# Patient Record
Sex: Male | Born: 1955 | Race: White | Hispanic: No | Marital: Married | Smoking: Never smoker
Health system: Southern US, Community
[De-identification: ages and names within clinical notes are randomized; demographics above are authoritative.]

## PROBLEM LIST (undated history)

## (undated) DIAGNOSIS — E785 Hyperlipidemia, unspecified: Secondary | ICD-10-CM

## (undated) DIAGNOSIS — I10 Essential (primary) hypertension: Secondary | ICD-10-CM

## (undated) DIAGNOSIS — I255 Ischemic cardiomyopathy: Secondary | ICD-10-CM

## (undated) DIAGNOSIS — I5023 Acute on chronic systolic (congestive) heart failure: Secondary | ICD-10-CM

## (undated) DIAGNOSIS — E119 Type 2 diabetes mellitus without complications: Secondary | ICD-10-CM

## (undated) HISTORY — PX: CARDIAC SURGERY: SHX584

## (undated) HISTORY — DX: Ischemic cardiomyopathy: I25.5

## (undated) HISTORY — PX: TONSILLECTOMY: SUR1361

## (undated) HISTORY — PX: DENTAL SURGERY: SHX609

## (undated) HISTORY — DX: Acute on chronic systolic (congestive) heart failure: I50.23

## (undated) HISTORY — DX: Hyperlipidemia, unspecified: E78.5

---

## 2012-03-13 NOTE — H&P (Signed)
Peter Griffin is an 57 y.o. male.   Chief Complaint:  Acute left quadriceps rupture On the job  03/11/12 HPI: 57yo male works for [employer] injured pulling hose to the fill tank and his foot stuck in the mud causing him to fall with left knee hyperflexion injury landing on his foot with sharp knee pain and inability to extend his knee. Seen at [facility], no xrays done. Placed in a knee immobilizer , given crutches which he has difficulty with.   No past medical history on file.    Diabetes  Type 2, oral med controlled, HTN  No past surgical history on file. neg.   No family history on file.  Stomach CA, diabetes, HTN Social History:  does not have a smoking history on file. He does not have any smokeless tobacco history on file. His alcohol and drug histories not on file.   Does not smoke, neg ETOH, neg Drug use  Allergies: Allergies not on file  NKDA  No prescriptions prior to admission  ASA 81mg  daily, Losartan/HCTZ 100/25mg  po qAM, Amlodipine CL 10 mg po q PM, Glipizide 5mg  one half tablet PO q AM, Cardura 4mg   One half tablet PO q PM,  Lutein OTC ,  Fish oil 100mg  OTC  No results found for this or any previous visit (from the past 48 hour(s)). No results found.  Review of Systems  Constitutional: Negative for fever and weight loss.  HENT: Negative for ear pain and congestion.   Eyes: Negative for blurred vision and pain.  Respiratory: Negative for cough, sputum production, shortness of breath and wheezing.   Cardiovascular: Negative for chest pain, orthopnea, claudication and leg swelling.  Gastrointestinal: Negative.   Genitourinary: Negative.   Musculoskeletal:       Above acute OTJI quad tendon rupture left  Skin: Negative for itching and rash.  Neurological: Positive for dizziness. Negative for sensory change and headaches.  Endo/Heme/Allergies:       Pos for diabetes, oral controlled  Psychiatric/Behavioral: Negative for depression, memory loss and substance  abuse. The patient is not nervous/anxious.     There were no vitals taken for this visit.   Hgt. 5'10"    Wgt 189 lbs.  Physical Exam  Constitutional: He appears well-developed and well-nourished.  HENT:  Head: Normocephalic.  Cardiovascular: Normal rate and regular rhythm.   Respiratory: Effort normal and breath sounds normal.  GI: Soft. Bowel sounds are normal.  Musculoskeletal:       2cm defect above patella with no knee extension, patella baja,   Intact pulses and sensation to foot, collateral and cruciate ligaments normal.  Left leg.             Right knee normal ROM, normal SLR, normal quad and ligament exam.  Normal right and left hip ROM  Skin: Skin is warm and dry.  Psychiatric: He has a normal mood and affect. His behavior is normal.    XRAYS LEFT KNEE--    Calcification avulsion of quad tendon retracted  2 to 3 cm with patella baja.  No degen knee changes.  Assessment/Plan Acute quad tendon rupture left knee.   He has a walker he can use over the weekend. Will proceed with acute quad tendon repair left knee.   overnite stay , possible 2nd night if not safe for ambulation POD1.  Risks of surgery discussed , infection, rerupture, stiffness, importance of post op rehab discussed. Risks of DVT, bleeding, and anesthetic problems discussed. He understands and  requests we proceed.  After 5PM, will notify W/C carrier early AM Monday.  All ?'s answered.   Antanette Richwine C 03/13/2012, 5:49 PM

## 2012-03-15 MED ORDER — DEXTROSE 5 % IV SOLN: 2.0000 g | INTRAVENOUS | Status: DC

## 2012-03-16 ENCOUNTER — Inpatient Hospital Stay (HOSPITAL_COMMUNITY): Payer: Worker's Compensation

## 2012-03-16 ENCOUNTER — Observation Stay (HOSPITAL_COMMUNITY)
Admission: RE | Admit: 2012-03-16 | Discharge: 2012-03-17 | Disposition: A | Discharge status: Home / Self Care | Payer: Worker's Compensation | Source: Ambulatory Visit | Attending: Orthopaedic Surgery | Admitting: Orthopaedic Surgery

## 2012-03-16 ENCOUNTER — Encounter (HOSPITAL_COMMUNITY): Payer: Self-pay | Admitting: *Deleted

## 2012-03-16 ENCOUNTER — Encounter (HOSPITAL_COMMUNITY)
Admission: RE | Disposition: A | Discharge status: Home / Self Care | Payer: Self-pay | Source: Ambulatory Visit | Attending: Orthopaedic Surgery

## 2012-03-16 ENCOUNTER — Encounter (HOSPITAL_COMMUNITY): Payer: Self-pay | Admitting: Anesthesiology

## 2012-03-16 ENCOUNTER — Inpatient Hospital Stay (HOSPITAL_COMMUNITY): Payer: Worker's Compensation | Admitting: Anesthesiology

## 2012-03-16 DIAGNOSIS — S838X9A Sprain of other specified parts of unspecified knee, initial encounter: Principal | ICD-10-CM | POA: Insufficient documentation

## 2012-03-16 DIAGNOSIS — Y9269 Other specified industrial and construction area as the place of occurrence of the external cause: Secondary | ICD-10-CM | POA: Insufficient documentation

## 2012-03-16 DIAGNOSIS — IMO0002 Reserved for concepts with insufficient information to code with codable children: Secondary | ICD-10-CM | POA: Insufficient documentation

## 2012-03-16 DIAGNOSIS — E119 Type 2 diabetes mellitus without complications: Secondary | ICD-10-CM | POA: Insufficient documentation

## 2012-03-16 DIAGNOSIS — S76119A Strain of unspecified quadriceps muscle, fascia and tendon, initial encounter: Secondary | ICD-10-CM

## 2012-03-16 DIAGNOSIS — I10 Essential (primary) hypertension: Secondary | ICD-10-CM | POA: Insufficient documentation

## 2012-03-16 DIAGNOSIS — Y990 Civilian activity done for income or pay: Secondary | ICD-10-CM | POA: Insufficient documentation

## 2012-03-16 DIAGNOSIS — X500XXA Overexertion from strenuous movement or load, initial encounter: Secondary | ICD-10-CM | POA: Insufficient documentation

## 2012-03-16 HISTORY — PX: QUADRICEPS TENDON REPAIR: SHX756

## 2012-03-16 HISTORY — DX: Type 2 diabetes mellitus without complications: E11.9

## 2012-03-16 HISTORY — DX: Essential (primary) hypertension: I10

## 2012-03-16 LAB — COMPREHENSIVE METABOLIC PANEL
ALT: 21 U/L (ref 0–53)
AST: 23 U/L (ref 0–37)
CO2: 26 mEq/L (ref 19–32)
Calcium: 9.2 mg/dL (ref 8.4–10.5)
Chloride: 102 mEq/L (ref 96–112)
Creatinine, Ser: 0.75 mg/dL (ref 0.50–1.35)
GFR calc Af Amer: >90 mL/min (ref >90)
GFR calc non Af Amer: >90 mL/min (ref >90)
Glucose, Bld: 176 mg/dL — ABNORMAL HIGH (ref 70–99)
Total Bilirubin: 0.5 mg/dL (ref 0.3–1.2)

## 2012-03-16 LAB — CBC
Hemoglobin: 15.4 g/dL (ref 13.0–17.0)
MCH: 29.7 pg (ref 26.0–34.0)
MCV: 84.4 fL (ref 78.0–100.0)
Platelets: 212 10*3/uL (ref 150–400)
RBC: 5.18 MIL/uL (ref 4.22–5.81)
WBC: 6.1 10*3/uL (ref 4.0–10.5)

## 2012-03-16 LAB — PROTIME-INR: INR: 1 (ref 0.00–1.49)

## 2012-03-16 LAB — GLUCOSE, CAPILLARY
Glucose-Capillary: 192 mg/dL — ABNORMAL HIGH (ref 70–99)
Glucose-Capillary: 151 mg/dL — ABNORMAL HIGH (ref 70–99)
Glucose-Capillary: 217 mg/dL — ABNORMAL HIGH (ref 70–99)

## 2012-03-16 SURGERY — REPAIR, TENDON, QUADRICEPS
Anesthesia: General | Site: Knee | Laterality: Left | Wound class: Clean

## 2012-03-16 MED ORDER — LIDOCAINE HCL (CARDIAC) 20 MG/ML IV SOLN
INTRAVENOUS | Status: DC | PRN
  Administered 2012-03-16: 80 mg via INTRAVENOUS

## 2012-03-16 MED ORDER — CEFAZOLIN SODIUM-DEXTROSE 2-3 GM-% IV SOLR
2.0000 g | Freq: Four times a day (QID) | INTRAVENOUS | Status: AC
  Administered 2012-03-16 – 2012-03-17 (×3): 2 g via INTRAVENOUS
  Filled 2012-03-16 (×5): qty 50

## 2012-03-16 MED ORDER — FENTANYL CITRATE 0.05 MG/ML IJ SOLN
INTRAMUSCULAR | Status: DC | PRN
  Administered 2012-03-16: 50 ug via INTRAVENOUS

## 2012-03-16 MED ORDER — DOCUSATE SODIUM 100 MG PO CAPS
100.0000 mg | ORAL_CAPSULE | Freq: Two times a day (BID) | ORAL | Status: DC
  Administered 2012-03-16 – 2012-03-17 (×3): 100 mg via ORAL
  Filled 2012-03-16 (×4): qty 1

## 2012-03-16 MED ORDER — KETOROLAC TROMETHAMINE 30 MG/ML IJ SOLN
30.0000 mg | Freq: Four times a day (QID) | INTRAMUSCULAR | Status: AC
  Administered 2012-03-16 – 2012-03-17 (×4): 30 mg via INTRAVENOUS
  Filled 2012-03-16 (×4): qty 1

## 2012-03-16 MED ORDER — ONDANSETRON HCL 4 MG/2ML IJ SOLN: 4.0000 mg | Freq: Once | INTRAMUSCULAR | Status: DC | PRN

## 2012-03-16 MED ORDER — GLYCOPYRROLATE 0.2 MG/ML IJ SOLN
INTRAMUSCULAR | Status: DC | PRN
  Administered 2012-03-16: 0.4 mg via INTRAVENOUS

## 2012-03-16 MED ORDER — ONDANSETRON HCL 4 MG/2ML IJ SOLN
INTRAMUSCULAR | Status: DC | PRN
  Administered 2012-03-16: 4 mg via INTRAVENOUS

## 2012-03-16 MED ORDER — EPHEDRINE SULFATE 50 MG/ML IJ SOLN
INTRAMUSCULAR | Status: DC | PRN
  Administered 2012-03-16: 10 mg via INTRAVENOUS

## 2012-03-16 MED ORDER — CEFAZOLIN SODIUM-DEXTROSE 2-3 GM-% IV SOLR
INTRAVENOUS | Status: AC
  Filled 2012-03-16: qty 50

## 2012-03-16 MED ORDER — GLIPIZIDE 2.5 MG HALF TABLET
2.5000 mg | ORAL_TABLET | Freq: Two times a day (BID) | ORAL | Status: DC
  Administered 2012-03-17 (×2): 2.5 mg via ORAL
  Filled 2012-03-16 (×3): qty 1

## 2012-03-16 MED ORDER — PROMETHAZINE HCL 25 MG/ML IJ SOLN: 6.2500 mg | INTRAMUSCULAR | Status: DC | PRN

## 2012-03-16 MED ORDER — LACTATED RINGERS IV SOLN
INTRAVENOUS | Status: DC
  Administered 2012-03-16: 11:00:00 via INTRAVENOUS

## 2012-03-16 MED ORDER — FLEET ENEMA 7-19 GM/118ML RE ENEM: 1.0000 | ENEMA | Freq: Once | RECTAL | Status: AC | PRN

## 2012-03-16 MED ORDER — MIDAZOLAM HCL 2 MG/2ML IJ SOLN
INTRAMUSCULAR | Status: AC
  Administered 2012-03-16: 2 mg via BUCCAL
  Filled 2012-03-16: qty 2

## 2012-03-16 MED ORDER — FENTANYL CITRATE 0.05 MG/ML IJ SOLN
25.0000 ug | INTRAMUSCULAR | Status: DC | PRN
  Administered 2012-03-16: 50 ug via INTRAVENOUS

## 2012-03-16 MED ORDER — ASPIRIN 325 MG PO TABS
325.0000 mg | ORAL_TABLET | Freq: Every day | ORAL | Status: DC
  Administered 2012-03-16 – 2012-03-17 (×2): 325 mg via ORAL
  Filled 2012-03-16 (×3): qty 1

## 2012-03-16 MED ORDER — 0.9 % SODIUM CHLORIDE (POUR BTL) OPTIME
TOPICAL | Status: DC | PRN
  Administered 2012-03-16: 1000 mL

## 2012-03-16 MED ORDER — ONDANSETRON HCL 4 MG PO TABS: 4.0000 mg | ORAL_TABLET | Freq: Four times a day (QID) | ORAL | Status: DC | PRN

## 2012-03-16 MED ORDER — ONDANSETRON HCL 4 MG/2ML IJ SOLN: 4.0000 mg | Freq: Four times a day (QID) | INTRAMUSCULAR | Status: DC | PRN

## 2012-03-16 MED ORDER — BISACODYL 10 MG RE SUPP: 10.0000 mg | Freq: Every day | RECTAL | Status: DC | PRN

## 2012-03-16 MED ORDER — CHLORHEXIDINE GLUCONATE 4 % EX LIQD: 60.0000 mL | Freq: Once | CUTANEOUS | Status: DC

## 2012-03-16 MED ORDER — PROPOFOL 10 MG/ML IV BOLUS
INTRAVENOUS | Status: DC | PRN
  Administered 2012-03-16: 200 mg via INTRAVENOUS

## 2012-03-16 MED ORDER — MIDAZOLAM HCL 2 MG/2ML IJ SOLN
0.5000 mg | Freq: Once | INTRAMUSCULAR | Status: AC | PRN
  Administered 2012-03-16: 1 mg via INTRAVENOUS

## 2012-03-16 MED ORDER — NEOSTIGMINE METHYLSULFATE 1 MG/ML IJ SOLN
INTRAMUSCULAR | Status: DC | PRN
  Administered 2012-03-16: 3 mg via INTRAVENOUS

## 2012-03-16 MED ORDER — AMLODIPINE BESYLATE 5 MG PO TABS
5.0000 mg | ORAL_TABLET | Freq: Every day | ORAL | Status: DC
  Administered 2012-03-16 – 2012-03-17 (×2): 5 mg via ORAL
  Filled 2012-03-16 (×3): qty 1

## 2012-03-16 MED ORDER — MUPIROCIN 2 % EX OINT
TOPICAL_OINTMENT | CUTANEOUS | Status: AC
  Filled 2012-03-16: qty 22

## 2012-03-16 MED ORDER — ZOLPIDEM TARTRATE 5 MG PO TABS: 5.0000 mg | ORAL_TABLET | Freq: Every evening | ORAL | Status: DC | PRN

## 2012-03-16 MED ORDER — INSULIN ASPART 100 UNIT/ML ~~LOC~~ SOLN
0.0000 [IU] | Freq: Three times a day (TID) | SUBCUTANEOUS | Status: DC
  Administered 2012-03-16 – 2012-03-17 (×2): 3 [IU] via SUBCUTANEOUS
  Administered 2012-03-17 (×2): 2 [IU] via SUBCUTANEOUS

## 2012-03-16 MED ORDER — SODIUM CHLORIDE 0.9 % IV SOLN: INTRAVENOUS | Status: DC

## 2012-03-16 MED ORDER — METHOCARBAMOL 500 MG PO TABS: 500.0000 mg | ORAL_TABLET | Freq: Four times a day (QID) | ORAL | Status: DC | PRN

## 2012-03-16 MED ORDER — OCUVITE-LUTEIN PO CAPS
1.0000 | ORAL_CAPSULE | Freq: Every day | ORAL | Status: DC
  Administered 2012-03-16 – 2012-03-17 (×2): 1 via ORAL
  Filled 2012-03-16 (×2): qty 1

## 2012-03-16 MED ORDER — OXYCODONE-ACETAMINOPHEN 5-325 MG PO TABS: 1.0000 | ORAL_TABLET | ORAL | Status: DC | PRN

## 2012-03-16 MED ORDER — LOSARTAN POTASSIUM-HCTZ 100-25 MG PO TABS: 1.0000 | ORAL_TABLET | Freq: Every day | ORAL | Status: DC

## 2012-03-16 MED ORDER — MEPERIDINE HCL 25 MG/ML IJ SOLN: 6.2500 mg | INTRAMUSCULAR | Status: DC | PRN

## 2012-03-16 MED ORDER — MORPHINE SULFATE 2 MG/ML IJ SOLN: 1.0000 mg | INTRAMUSCULAR | Status: DC | PRN

## 2012-03-16 MED ORDER — LOSARTAN POTASSIUM 50 MG PO TABS
100.0000 mg | ORAL_TABLET | Freq: Every day | ORAL | Status: DC
  Administered 2012-03-16 – 2012-03-17 (×2): 100 mg via ORAL
  Filled 2012-03-16 (×2): qty 2

## 2012-03-16 MED ORDER — FENTANYL CITRATE 0.05 MG/ML IJ SOLN
INTRAMUSCULAR | Status: AC
  Filled 2012-03-16: qty 2

## 2012-03-16 MED ORDER — METHOCARBAMOL 100 MG/ML IJ SOLN
500.0000 mg | Freq: Four times a day (QID) | INTRAVENOUS | Status: DC | PRN
  Filled 2012-03-16: qty 5

## 2012-03-16 MED ORDER — HYDROCODONE-ACETAMINOPHEN 5-325 MG PO TABS: 1.0000 | ORAL_TABLET | ORAL | Status: DC | PRN

## 2012-03-16 MED ORDER — METOCLOPRAMIDE HCL 5 MG/ML IJ SOLN: 5.0000 mg | Freq: Three times a day (TID) | INTRAMUSCULAR | Status: DC | PRN

## 2012-03-16 MED ORDER — SENNOSIDES-DOCUSATE SODIUM 8.6-50 MG PO TABS: 1.0000 | ORAL_TABLET | Freq: Every evening | ORAL | Status: DC | PRN

## 2012-03-16 MED ORDER — HYDROMORPHONE HCL PF 1 MG/ML IJ SOLN: 0.2500 mg | INTRAMUSCULAR | Status: DC | PRN

## 2012-03-16 MED ORDER — ROCURONIUM BROMIDE 100 MG/10ML IV SOLN
INTRAVENOUS | Status: DC | PRN
  Administered 2012-03-16: 40 mg via INTRAVENOUS

## 2012-03-16 MED ORDER — POTASSIUM CHLORIDE IN NACL 20-0.45 MEQ/L-% IV SOLN
INTRAVENOUS | Status: DC
  Administered 2012-03-16 – 2012-03-17 (×2): via INTRAVENOUS
  Filled 2012-03-16 (×6): qty 1000

## 2012-03-16 MED ORDER — ASPIRIN EC 325 MG PO TBEC: 325.0000 mg | DELAYED_RELEASE_TABLET | Freq: Every day | ORAL | Status: DC

## 2012-03-16 MED ORDER — METOCLOPRAMIDE HCL 10 MG PO TABS: 5.0000 mg | ORAL_TABLET | Freq: Three times a day (TID) | ORAL | Status: DC | PRN

## 2012-03-16 MED ORDER — HYDROCHLOROTHIAZIDE 25 MG PO TABS
25.0000 mg | ORAL_TABLET | Freq: Every day | ORAL | Status: DC
  Administered 2012-03-16 – 2012-03-17 (×2): 25 mg via ORAL
  Filled 2012-03-16 (×2): qty 1

## 2012-03-16 MED ORDER — DOXAZOSIN MESYLATE 2 MG PO TABS
2.0000 mg | ORAL_TABLET | Freq: Every day | ORAL | Status: DC
  Administered 2012-03-16: 2 mg via ORAL
  Filled 2012-03-16 (×2): qty 1

## 2012-03-16 MED ORDER — KETOROLAC TROMETHAMINE 30 MG/ML IJ SOLN: 15.0000 mg | Freq: Once | INTRAMUSCULAR | Status: DC | PRN

## 2012-03-16 MED ORDER — MUPIROCIN 2 % EX OINT: TOPICAL_OINTMENT | Freq: Once | CUTANEOUS | Status: DC

## 2012-03-16 MED ORDER — CEFAZOLIN SODIUM-DEXTROSE 2-3 GM-% IV SOLR
2.0000 g | INTRAVENOUS | Status: AC
  Administered 2012-03-16: 2 g via INTRAVENOUS

## 2012-03-16 SURGICAL SUPPLY — 51 items
BANDAGE ELASTIC 4 VELCRO ST LF (GAUZE/BANDAGES/DRESSINGS) ×2 IMPLANT
BANDAGE ELASTIC 6 VELCRO ST LF (GAUZE/BANDAGES/DRESSINGS) ×2 IMPLANT
BENZOIN TINCTURE PRP APPL 2/3 (GAUZE/BANDAGES/DRESSINGS) ×2 IMPLANT
BLADE SURG ROTATE 9660 (MISCELLANEOUS) ×2 IMPLANT
CLOSURE STERI-STRIP 1/4X4 (GAUZE/BANDAGES/DRESSINGS) ×2 IMPLANT
CLOTH BEACON ORANGE TIMEOUT ST (SAFETY) ×2 IMPLANT
COVER SURGICAL LIGHT HANDLE (MISCELLANEOUS) ×2 IMPLANT
CUFF TOURNIQUET SINGLE 34IN LL (TOURNIQUET CUFF) ×2 IMPLANT
CUFF TOURNIQUET SINGLE 44IN (TOURNIQUET CUFF) IMPLANT
DRSG ADAPTIC 3X8 NADH LF (GAUZE/BANDAGES/DRESSINGS) ×2 IMPLANT
DRSG EMULSION OIL 3X3 NADH (GAUZE/BANDAGES/DRESSINGS) ×2 IMPLANT
DRSG PAD ABDOMINAL 8X10 ST (GAUZE/BANDAGES/DRESSINGS) ×2 IMPLANT
ELECT REM PT RETURN 9FT ADLT (ELECTROSURGICAL) ×2
ELECTRODE REM PT RTRN 9FT ADLT (ELECTROSURGICAL) ×1 IMPLANT
GLOVE BIOGEL PI IND STRL 7.5 (GLOVE) ×1 IMPLANT
GLOVE BIOGEL PI IND STRL 8 (GLOVE) ×1 IMPLANT
GLOVE BIOGEL PI INDICATOR 7.5 (GLOVE) ×1
GLOVE BIOGEL PI INDICATOR 8 (GLOVE) ×1
GLOVE ECLIPSE 7.0 STRL STRAW (GLOVE) ×2 IMPLANT
GLOVE ORTHO TXT STRL SZ7.5 (GLOVE) ×2 IMPLANT
GOWN PREVENTION PLUS LG XLONG (DISPOSABLE) ×2 IMPLANT
GOWN PREVENTION PLUS XLARGE (GOWN DISPOSABLE) ×4 IMPLANT
KIT BASIN OR (CUSTOM PROCEDURE TRAY) ×2 IMPLANT
KIT ROOM TURNOVER OR (KITS) ×2 IMPLANT
MANIFOLD NEPTUNE II (INSTRUMENTS) ×2 IMPLANT
NEEDLE 22X1 1/2 (OR ONLY) (NEEDLE) IMPLANT
NS IRRIG 1000ML POUR BTL (IV SOLUTION) ×2 IMPLANT
PACK ORTHO EXTREMITY (CUSTOM PROCEDURE TRAY) ×2 IMPLANT
PAD ARMBOARD 7.5X6 YLW CONV (MISCELLANEOUS) ×4 IMPLANT
PAD CAST 4YDX4 CTTN HI CHSV (CAST SUPPLIES) ×1 IMPLANT
PADDING CAST COTTON 4X4 STRL (CAST SUPPLIES) ×1
SPONGE GAUZE 4X4 12PLY (GAUZE/BANDAGES/DRESSINGS) ×2 IMPLANT
SPONGE GAUZE 4X4 16PLY UNSTER (WOUND CARE) ×2 IMPLANT
SPONGE LAP 4X18 X RAY DECT (DISPOSABLE) ×4 IMPLANT
STAPLER VISISTAT 35W (STAPLE) ×2 IMPLANT
STRIP CLOSURE SKIN 1/2X4 (GAUZE/BANDAGES/DRESSINGS) ×2 IMPLANT
SUCTION FRAZIER TIP 10 FR DISP (SUCTIONS) ×2 IMPLANT
SUT ETHIBOND NAB CT1 #1 30IN (SUTURE) ×2 IMPLANT
SUT FIBERWIRE #2 38 REV NDL BL (SUTURE) ×4
SUT VIC AB 0 CT1 27 (SUTURE) ×1
SUT VIC AB 0 CT1 27XBRD ANBCTR (SUTURE) ×1 IMPLANT
SUT VIC AB 2-0 CT1 27 (SUTURE) ×1
SUT VIC AB 2-0 CT1 TAPERPNT 27 (SUTURE) ×1 IMPLANT
SUTURE FIBERWR#2 38 REV NDL BL (SUTURE) ×2 IMPLANT
SYR CONTROL 10ML LL (SYRINGE) IMPLANT
TOWEL OR 17X24 6PK STRL BLUE (TOWEL DISPOSABLE) ×2 IMPLANT
TOWEL OR 17X26 10 PK STRL BLUE (TOWEL DISPOSABLE) ×2 IMPLANT
TUBE CONNECTING 12X1/4 (SUCTIONS) ×2 IMPLANT
UNDERPAD 30X30 INCONTINENT (UNDERPADS AND DIAPERS) ×2 IMPLANT
WATER STERILE IRR 1000ML POUR (IV SOLUTION) ×2 IMPLANT
YANKAUER SUCT BULB TIP NO VENT (SUCTIONS) IMPLANT

## 2012-03-16 NOTE — Anesthesia Postprocedure Evaluation (Signed)
  Anesthesia Post-op Note  Patient: Peter Griffin  Procedure(s) Performed: Procedure(s): REPAIR QUADRICEP TENDON (Left)  Patient Location: PACU  Anesthesia Type:General  Level of Consciousness: awake, oriented and patient cooperative  Airway and Oxygen Therapy: Patient Spontanous Breathing  Post-op Pain: mild  Post-op Assessment: Post-op Vital signs reviewed, Patient's Cardiovascular Status Stable, Respiratory Function Stable, Patent Airway, No signs of Nausea or vomiting and Pain level controlled  Post-op Vital Signs: stable  Complications: No apparent anesthesia complications

## 2012-03-16 NOTE — Anesthesia Procedure Notes (Signed)
Anesthesia Regional Block:  Femoral nerve block  Pre-Anesthetic Checklist: ,, timeout performed, Correct Patient, Correct Site, Correct Laterality, Correct Procedure, Correct Position, site marked, Risks and benefits discussed,  Surgical consent,  Pre-op evaluation,  At surgeon's request and post-op pain management  Laterality: Left  Prep: chloraprep       Needles:  Injection technique: Single-shot  Needle Type: Stimiplex          Additional Needles:  Procedures: ultrasound guided (picture in chart) and nerve stimulator Femoral nerve block Narrative:  Start time: 03/16/2012 10:45 AM End time: 03/16/2012 11:00 AM Injection made incrementally with aspirations every 5 mL.  Performed by: Personally   Additional Notes: Risks, benefits and alternative to block explained extensively.  Patient tolerated procedure well, without complications.  Femoral nerve block

## 2012-03-16 NOTE — Op Note (Signed)
Test test  Preop diagnosis left quadriceps tendon rupture  Postop diagnosis: Same  Procedure: Repair left quadriceps tendon rupture  Surgeon: Annell Greening M.D.  Asst.: Maud Deed PA-C  Anesthesia Gen. plus preoperative femoral nerve block  Tourniquet time 27 minutes  Procedure after induction general anesthesia oral tracheal intubation proximal thigh tourniquet application prepping with DuraPrep down ankle impervious stockinette Caban and externally sheets and drapes proximal timeout without clip were applied. Timeout procedure was completed 2 g Ancef given prophylactically. Still skin Loraine Leriche was used anteriorly  over the distal thigh and patella longitudinally in the midline. Betadine Steri-Drape had been applied prior to making the skin incision in timeout procedure then completed.  Superficial retinaculum was divided and the hematoma was evacuated with serosanguineous fluid extend down into the knee joint. Undersurface of the patella was normal no loose bodies were noted inside the knee. Knee was irrigated. It was flexed anterior cruciate ligament tear appeared normal.  Sutures are placed to the quad tendon #2 FiberWire drawls were drilled small drill and then keep needles are passed up through the patella from distal to proximal so that. Sutures were tied together directly over bone and went to the stump of the old quad tendon attached to the patella and then in Kessler modified the edema sutures placed in the quad tendon a total of 7 sutures are placed with a total of a drawls through the patella. #2 Tycron were used or #2 Ethibond were used for the retinacular repair medial and lateral extending to the medial and lateral collateral ligaments. These were figure-of-eight and horizontal mattress sutures. Sutures were tied down followed by tying down the sutures directly over the patella was attention was applied for a anatomic repair. Additional sutures were placed for the prepatellar bursa  over the top. Patient had thickening of the distal quad tendon at consistent with chronic tendinopathy. Had an acute on-the-job injury slipped fell landed on the flexed knee with hyperflexion of the knee and quadriceps rupture.  After initial solution standard closure was performed with 2-0 Vicryl and the the superficial retinaculum and prepatellar bursa overlying the repair to him subcutaneous tissue subcuticular closure postop dressing and knee immobilizer after wrapped subcuticular skin closure. Patient tolerated procedure well. He'll stay overnight for IV pain medication and mobilization with therapy.  Annell Greening M.D.

## 2012-03-16 NOTE — Transfer of Care (Signed)
Immediate Anesthesia Transfer of Care Note  Patient: Peter Griffin  Procedure(s) Performed: Procedure(s): REPAIR QUADRICEP TENDON (Left)  Patient Location: PACU  Anesthesia Type:General and Regional  Level of Consciousness: awake and alert   Airway & Oxygen Therapy: Patient Spontanous Breathing and Patient connected to nasal cannula oxygen  Post-op Assessment: Report given to PACU RN and Post -op Vital signs reviewed and stable  Post vital signs: Reviewed and stable  Complications: No apparent anesthesia complications

## 2012-03-16 NOTE — Anesthesia Preprocedure Evaluation (Addendum)
Anesthesia Evaluation  Patient identified by MRN, date of birth, ID band Patient awake    Reviewed: Allergy & Precautions, H&P , NPO status , Patient's Chart, lab work & pertinent test results, reviewed documented beta blocker date and time   History of Anesthesia Complications Negative for: history of anesthetic complications  Airway Mallampati: II TM Distance: >3 FB Neck ROM: full    Dental no notable dental hx. (+) Edentulous Upper and Edentulous Lower   Pulmonary neg pulmonary ROS,  breath sounds clear to auscultation  Pulmonary exam normal       Cardiovascular Exercise Tolerance: Good hypertension, Pt. on medications negative cardio ROS  Rhythm:regular Rate:Normal     Neuro/Psych negative neurological ROS  negative psych ROS   GI/Hepatic negative GI ROS, Neg liver ROS,   Endo/Other  negative endocrine ROSdiabetes, Well Controlled, Type 2, Oral Hypoglycemic Agents  Renal/GU negative Renal ROS     Musculoskeletal   Abdominal   Peds  Hematology negative hematology ROS (+)   Anesthesia Other Findings   Reproductive/Obstetrics negative OB ROS                          Anesthesia Physical Anesthesia Plan  ASA: III  Anesthesia Plan: General ETT, General and Regional   Post-op Pain Management: MAC Combined w/ Regional for Post-op pain   Induction: Intravenous  Airway Management Planned: Oral ETT  Additional Equipment:   Intra-op Plan:   Post-operative Plan: Extubation in OR  Informed Consent: I have reviewed the patients History and Physical, chart, labs and discussed the procedure including the risks, benefits and alternatives for the proposed anesthesia with the patient or authorized representative who has indicated his/her understanding and acceptance.   Dental Advisory Given  Plan Discussed with: CRNA, Surgeon and Anesthesiologist  Anesthesia Plan Comments:        Anesthesia Quick Evaluation

## 2012-03-16 NOTE — Interval H&P Note (Signed)
History and Physical Interval Note:  03/16/2012 11:19 AM  Peter Griffin  has presented today for surgery, with the diagnosis of aute fracture  The various methods of treatment have been discussed with the patient and family. After consideration of risks, benefits and other options for treatment, the patient has consented to  Procedure(s): REPAIR QUADRICEP TENDON (Left) as a surgical intervention .  The patient's history has been reviewed, patient examined, no change in status, stable for surgery.  I have reviewed the patient's chart and labs.  Questions were answered to the patient's satisfaction.     Aisha Greenberger C

## 2012-03-16 NOTE — Brief Op Note (Cosign Needed)
03/16/2012  12:42 PM  PATIENT:  Peter Griffin  57 y.o. male  PRE-OPERATIVE DIAGNOSIS:  Acute left quadriceps tendon rupture  POST-OPERATIVE DIAGNOSIS:  Acute left quadriceps tendon rupture  PROCEDURE:  Procedure(s): REPAIR QUADRICEP TENDON (Left)  SURGEON:  Surgeon(s) and Role:    * Eldred Manges, MD - Primary  PHYSICIAN ASSISTANT: Maud Deed PAC  ASSISTANTS: none   ANESTHESIA:   general  EBL:     BLOOD ADMINISTERED:none  DRAINS: none   LOCAL MEDICATIONS USED:  NONE  SPECIMEN:  No Specimen  DISPOSITION OF SPECIMEN:  N/A  COUNTS:  YES  TOURNIQUET:   Total Tourniquet Time Documented: Thigh (Left) - 29 minutes Total: Thigh (Left) - 29 minutes   DICTATION: .Note written in EPIC  PLAN OF CARE: Admit for overnight observation  PATIENT DISPOSITION:  PACU - hemodynamically stable.   Delay start of Pharmacological VTE agent (>24hrs) due to surgical blood loss or risk of bleeding: yes

## 2012-03-16 NOTE — Progress Notes (Signed)
Orthopedic Tech Progress Note Patient Details:  Peter Griffin 05/30/1955 [id number] Spoke with PACU nurse, patient came out of surgery with knee immobilizer. No action needed from [employer] [facility].  Patient ID: Peter Griffin, male   DOB: [birth year], 57 y.o.   MRN: [id number]   [name] 03/16/2012, 2:03 PM

## 2012-03-16 NOTE — Progress Notes (Signed)
Orthopedic Tech Progress Note Patient Details:  Peter Griffin February 21, 1955 [id number]  Patient ID: [name], male   DOB: Feb 07, 1955, 57 y.o.   MRN: [id number]  Trapeze bar patient helper Nikki Dom 03/16/2012, 4:56 PM

## 2012-03-17 ENCOUNTER — Encounter (HOSPITAL_COMMUNITY): Payer: Self-pay | Admitting: Orthopaedic Surgery

## 2012-03-17 LAB — GLUCOSE, CAPILLARY: Glucose-Capillary: 198 mg/dL — ABNORMAL HIGH (ref 70–99)

## 2012-03-17 MED ORDER — TAMSULOSIN HCL 0.4 MG PO CAPS
0.4000 mg | ORAL_CAPSULE | Freq: Every day | ORAL | Status: DC
  Administered 2012-03-17: 0.4 mg via ORAL
  Filled 2012-03-17: qty 1

## 2012-03-17 NOTE — Progress Notes (Signed)
Flomax started, and foley catheter removed at 1530. Patient was able to spontaneously void . Per order, he is now being discharged home.

## 2012-03-17 NOTE — Progress Notes (Signed)
Inpatient Diabetes Program Recommendations  AACE/ADA: New Consensus Statement on Inpatient Glycemic Control (2013)  Target Ranges:  Prepandial:   less than 140 mg/dL      Peak postprandial:   less than 180 mg/dL (1-2 hours)      Critically ill patients:  140 - 180 mg/dL  Results for ROSWELL, NDIAYE (MRN [id number]) as of 03/17/2012 10:33  Ref. Range 03/16/2012 10:01 03/16/2012 12:54 03/16/2012 15:45 03/16/2012 21:51 03/17/2012 06:58  Glucose-Capillary Latest Range: 70-99 mg/dL 147 (H) 829 (H) 562 (H) 217 (H) 198 (H)     Inpatient Diabetes Program Recommendations Correction (SSI): Please consider adding bedtme Novolog correction. HgbA1C: Please consider ordering an A1C to determine glycemic control over the last 2-3 months.  Thanks, Orlando Penner, RN, BSN, CCRN Diabetes Coordinator Inpatient Diabetes Program [phone]

## 2012-03-17 NOTE — Evaluation (Signed)
Physical Therapy Evaluation Patient Details Name: Shaka Cardin MRN: [id number] DOB: 05-15-1955 Today's Date: 03/17/2012 Time: 3086-5784 PT Time Calculation (min): 42 min  PT Assessment / Plan / Recommendation Clinical Impression  pt presents with L TKA.  pt moving well and ready for D/C to home with wife A from PT stand point.  pt would benefit from OPPT once cleared by MD.      PT Assessment  Patient needs continued PT services    Follow Up Recommendations  Outpatient PT    Does the patient have the potential to tolerate intense rehabilitation      Barriers to Discharge None      Equipment Recommendations  Rolling walker with 5" wheels (3-in-1)    Recommendations for Other Services OT consult   Frequency Min 5X/week    Precautions / Restrictions Precautions Precautions: Fall Required Braces or Orthoses: Knee Immobilizer - Left Knee Immobilizer - Left: On at all times Restrictions Weight Bearing Restrictions: Yes LLE Weight Bearing: Weight bearing as tolerated   Pertinent Vitals/Pain Pt indicates pain is about a "medium" and that he has had pain meds earlier this am.        Mobility  Bed Mobility Bed Mobility: Supine to Sit;Sitting - Scoot to Edge of Bed Supine to Sit: 4: Min assist Sitting - Scoot to Edge of Bed: 4: Min guard Details for Bed Mobility Assistance: A with L LE only.  cues for crossing ankles to A with movig L LE.   Transfers Transfers: Sit to Stand;Stand to Sit Sit to Stand: 4: Min guard;With upper extremity assist;From bed Stand to Sit: 4: Min guard;With upper extremity assist;To chair/3-in-1;With armrests Details for Transfer Assistance: cues fro UE use, positioning of LEs, controlling descent to chair.   Ambulation/Gait Ambulation/Gait Assistance: 4: Min guard Ambulation Distance (Feet): 110 Feet Assistive device: Rolling walker Ambulation/Gait Assistance Details: cues for upright posture, gait sequencing, positioning in RW.   Gait  Pattern: Step-to pattern;Decreased step length - right;Decreased stance time - left;Decreased stride length;Trunk flexed Stairs: Yes Stairs Assistance: 4: Min guard Stair Management Technique: One rail Left;Sideways Number of Stairs: 2 Wheelchair Mobility Wheelchair Mobility: No    Exercises     PT Diagnosis: Difficulty walking;Acute pain  PT Problem List: Decreased activity tolerance;Decreased balance;Decreased strength;Decreased range of motion;Decreased mobility;Decreased knowledge of use of DME;Pain PT Treatment Interventions: DME instruction;Gait training;Stair training;Functional mobility training;Therapeutic activities;Therapeutic exercise;Balance training;Patient/family education   PT Goals Acute Rehab PT Goals PT Goal Formulation: With patient Time For Goal Achievement: 03/24/12 Potential to Achieve Goals: Good Pt will go Supine/Side to Sit: with modified independence PT Goal: Supine/Side to Sit - Progress: Goal set today Pt will go Sit to Supine/Side: with modified independence PT Goal: Sit to Supine/Side - Progress: Goal set today Pt will go Sit to Stand: with modified independence PT Goal: Sit to Stand - Progress: Goal set today Pt will go Stand to Sit: with modified independence PT Goal: Stand to Sit - Progress: Goal set today Pt will Ambulate: >150 feet;with modified independence;with rolling walker PT Goal: Ambulate - Progress: Goal set today Pt will Go Up / Down Stairs: 1-2 stairs;with supervision;with rail(s) PT Goal: Up/Down Stairs - Progress: Goal set today  Visit Information  Last PT Received On: 03/17/12 Assistance Needed: +1    Subjective Data  Subjective: I just want to make sure I'm doing this right.   Patient Stated Goal: Home   Prior Functioning  Home Living Lives With: Spouse Available Help at Discharge: Family;Available 24 hours/day  Type of Home: House Home Access: Stairs to enter [employer] of Steps: 2 Entrance Stairs-Rails:  Left Home Layout: One level Bathroom Toilet: Standard Home Adaptive Equipment: Walker - standard Prior Function Level of Independence: Independent Able to Take Stairs?: Yes Driving: Yes Vocation: Full time employment Communication Communication: No difficulties    Cognition  Cognition Overall Cognitive Status: Appears within functional limits for tasks assessed/performed Arousal/Alertness: Awake/alert Orientation Level: Appears intact for tasks assessed Behavior During Session: [facility] for tasks performed    Extremity/Trunk Assessment Right Lower Extremity Assessment RLE ROM/Strength/Tone: WFL for tasks assessed RLE Sensation: WFL - Light Touch Left Lower Extremity Assessment LLE ROM/Strength/Tone: Deficits LLE ROM/Strength/Tone Deficits: pt in KI at all times.  Ankle and hip WFL.   LLE Sensation: WFL - Light Touch Trunk Assessment Trunk Assessment: Normal   Balance Balance Balance Assessed: No  End of Session PT - End of Session Equipment Utilized During Treatment: Gait belt;Left knee immobilizer Activity Tolerance: Patient tolerated treatment well Patient left: in chair;with call bell/phone within reach;with family/visitor present Nurse Communication: Mobility status  GP Functional Assessment Tool Used: Clinical Judgement Functional Limitation: Mobility: Walking and moving around Mobility: Walking and Moving Around Current Status (Z6109): At least 1 percent but less than 20 percent impaired, limited or restricted Mobility: Walking and Moving Around Goal Status [phone]): 0 percent impaired, limited or restricted   Sunny Schlein, Horace [phone] 03/17/2012, 10:59 AM

## 2012-03-17 NOTE — Progress Notes (Signed)
CARE MANAGEMENT NOTE 03/17/2012  Patient:  Zajicek,Trooper   Account Number:  [id number]  Date Initiated:  03/17/2012  Documentation initiated by:  Vance Peper  Subjective/Objective Assessment:   57 yr old male s/p left quadracep tendon repair     Action/Plan:   CM spoke with patient concerning DME needs. patient is worker's comp.   Anticipated DC Date:  03/17/2012   Anticipated DC Plan:  HOME/SELF CARE      DC Planning Services  CM consult      PAC Choice  DURABLE MEDICAL EQUIPMENT   Choice offered to / List presented to:     DME arranged  WALKER - ROLLING        HH arranged  NA      Status of service:  Completed, signed off Medicare Important Message given?   (If response is "NO", the following Medicare IM given date fields will be blank) Date Medicare IM given:   Date Additional Medicare IM given:    Discharge Disposition:  HOME/SELF CARE  Per UR Regulation:    If discussed at Long Length of Stay Meetings, dates discussed:    Comments:  03/17/12 1600 Vance Peper, RN BSN Case Manager Case Manager  contacted [employer] [phone] with [employer] claim #[id number] Fax: [phone], instructed to fax order for DME to [facility]-[phone]. Patient's VQ#[phone]

## 2012-03-17 NOTE — Progress Notes (Signed)
Utilization review completed. Juvenal Umar, RN, BSN. 

## 2012-03-17 NOTE — Progress Notes (Signed)
Patient given discharge instructions and follow up information. He is going home with his wife.

## 2012-03-17 NOTE — Progress Notes (Signed)
Subjective: 1 Day Post-Op Procedure(s) (LRB): REPAIR QUADRICEP TENDON (Left) Patient reports pain as mild.  Pt with urinary retention requiring foley placement at 3 am.  No history of prostate disease for previous bladder problems.  Reports he voided once on his own before he had cath.   Ordered Flomax which was given earlier Pt and wife want to go home this afternoon  Has ambulated in the hallway.  Objective: Vital signs in last 24 hours: Temp:  [97.9 F (36.6 C)-98.5 F (36.9 C)] 98.5 F (36.9 C) (02/11 0619) Pulse Rate:  [72-88] 88 (02/11 0619) Resp:  [16-18] 18 (02/11 0619) BP: (128-137)/(75-82) 137/75 mmHg (02/11 0619) SpO2:  [100 %] 100 % (02/11 0619)  Intake/Output from previous day: 02/10 0701 - 02/11 0700 In: 1650 [P.O.:580; I.V.:1070] Out: 2100 [Urine:2100] Intake/Output this shift: Total I/O In: 240 [P.O.:240] Out: -    Recent Labs  03/16/12 0931  HGB 15.4    Recent Labs  03/16/12 0931  WBC 6.1  RBC 5.18  HCT 43.7  PLT 212    Recent Labs  03/16/12 0931  NA 138  K 4.0  CL 102  CO2 26  BUN 12  CREATININE 0.75  GLUCOSE 176*  CALCIUM 9.2    Recent Labs  03/16/12 0931  INR 1.00    Neurovascular intact Sensation intact distally Incision: dressing C/D/I  Assessment/Plan: 1 Day Post-Op Procedure(s) (LRB): REPAIR QUADRICEP TENDON (Left) Up with therapy Will dc foley now and give chance for voiding.  Explained to pt that he will need to stay and have cath replaced for short term if he is unable to void. If he voids then will go later today. rx and AVS on chart.  Nazaria Riesen M 03/17/2012, 3:10 PM

## 2012-03-17 NOTE — Plan of Care (Signed)
Problem: Phase I Progression Outcomes Goal: Voiding-avoid urinary catheter unless indicated Outcome: Not Met (add Reason) Foley re-inserted

## 2012-03-30 NOTE — Discharge Summary (Signed)
Physician Discharge Summary  Patient ID: Peter Griffin MRN: [id number] DOB/AGE: 58-13-1957 58 y.o.  Admit date: 03/16/2012 Discharge date: 03/30/2012  Admission Diagnoses:  Rupture of quadriceps tendon left  Discharge Diagnoses:  Principal Problem:   Rupture of quadriceps tendon Active Problems:   Acute urinary retention   Past Medical History  Diagnosis Date  . Hypertension   . Diabetes mellitus without complication     Surgeries: Procedure(s): REPAIR LEFT QUADRICEP TENDON on 03/16/2012   Consultants (if any):  NONE  Discharged Condition: Improved  Hospital Course: Peter Griffin is an 57 y.o. male who was admitted 03/16/2012 with a diagnosis of Rupture of quadriceps tendon and went to the operating room on 03/16/2012 and underwent the above named procedures.    He was given perioperative antibiotics:  Anti-infectives   Start     Dose/Rate Route Frequency Ordered Stop   03/16/12 1545  ceFAZolin (ANCEF) IVPB 2 g/50 mL premix     2 g 100 mL/hr over 30 Minutes Intravenous Every 6 hours 03/16/12 1534 03/17/12 0455   03/16/12 0930  ceFAZolin (ANCEF) 2-3 GM-% IVPB SOLR    Comments:  SOMERS, STACY: cabinet override      03/16/12 0930 03/16/12 2129   03/16/12 0920  ceFAZolin (ANCEF) IVPB 2 g/50 mL premix     2 g 100 mL/hr over 30 Minutes Intravenous 60 min pre-op 03/16/12 0920 03/16/12 1135   03/15/12 1057  ceFAZolin (ANCEF) 2 g in dextrose 5 % 50 mL IVPB  Status:  Discontinued     2 g 140 mL/hr over 30 Minutes Intravenous 60 min pre-op 03/15/12 1057 03/15/12 1100   03/15/12 1056  ceFAZolin (ANCEF) 2 g in dextrose 5 % 50 mL IVPB  Status:  Discontinued     2 g 140 mL/hr over 30 Minutes Intravenous On call to O.R. 03/15/12 1056 03/15/12 1056    .  He was given sequential compression devices, early ambulation, and aspirin for DVT prophylaxis. Required insertion of foley cath for urinary retention.  Flomax used and pt was able to pass voiding trial when foley removed.  Knee  immobilizer used full time to left leg for stability. He benefited maximally from the hospital stay and there were no complications.    Recent vital signs:  Filed Vitals:   03/17/12 1633  BP: 137/86  Pulse: 85  Temp:   Resp: 18    Recent laboratory studies:  Lab Results  Component Value Date   HGB 15.4 03/16/2012   Lab Results  Component Value Date   WBC 6.1 03/16/2012   PLT 212 03/16/2012   Lab Results  Component Value Date   INR 1.00 03/16/2012   Lab Results  Component Value Date   NA 138 03/16/2012   K 4.0 03/16/2012   CL 102 03/16/2012   CO2 26 03/16/2012   BUN 12 03/16/2012   CREATININE 0.75 03/16/2012   GLUCOSE 176* 03/16/2012    Discharge Medications:     Medication List    TAKE these medications       amLODipine 5 MG tablet  Commonly known as:  NORVASC  Take 5 mg by mouth daily.     aspirin EC 325 MG tablet  Take 1 tablet (325 mg total) by mouth daily.     doxazosin 4 MG tablet  Commonly known as:  CARDURA  Take 2 mg by mouth at bedtime.     fish oil-omega-3 fatty acids 1000 MG capsule  Take 1 g by mouth daily.  glipiZIDE 5 MG tablet  Commonly known as:  GLUCOTROL  Take 2.5 mg by mouth 2 (two) times daily before a meal.     losartan-hydrochlorothiazide 100-25 MG per tablet  Commonly known as:  HYZAAR  Take 1 tablet by mouth daily.     methocarbamol 500 MG tablet  Commonly known as:  ROBAXIN  Take 1 tablet (500 mg total) by mouth every 6 (six) hours as needed.     multivitamin-lutein Caps  Take 1 capsule by mouth daily.     oxyCODONE-acetaminophen 5-325 MG per tablet  Commonly known as:  ROXICET  Take 1-2 tablets by mouth every 4 (four) hours as needed for pain.        Diagnostic Studies: Dg Chest 2 View  03/16/2012  *RADIOLOGY REPORT*  Clinical Data: Preoperative assessment, left triceps rupture, hypertension, diabetes  CHEST - 2 VIEW  Comparison: None  Findings: Normal heart size, mediastinal contours, and pulmonary vascularity. Lungs  are appear mildly emphysematous. No definite infiltrate, pleural effusion or pneumothorax. No acute osseous findings.  IMPRESSION: Question mild emphysematous changes. No acute infiltrate.   Original Report Authenticated By: Ulyses Southward, M.D.     Disposition: 01-Home or Self Care      Discharge Orders   Future Orders Complete By Expires     Call MD / Call 911  As directed     Comments:      If you experience chest pain or shortness of breath, CALL 911 and be transported to the hospital emergency room.  If you develope a fever above 101 F, pus (white drainage) or increased drainage or redness at the wound, or calf pain, call your surgeon's office.    Constipation Prevention  As directed     Comments:      Drink plenty of fluids.  Prune juice may be helpful.  You may use a stool softener, such as Colace (over the counter) 100 mg twice a day.  Use MiraLax (over the counter) for constipation as needed.    Diet - low sodium heart healthy  As directed     Increase activity slowly as tolerated  As directed      Wear knee immobilizer at all times.  Weight bear as tolerated on left for ambulation with walker or crutches.  Ice packs to knee as needed for swelling and pain.  Change dressing daily or as needed. ABSOLUTELY NO MOTION OF LEFT KNEE.  Follow-up Information   Follow up with Eldred Manges, MD. Schedule an appointment as soon as possible for a visit in 2 weeks.   Contact information:   [address] [place] [address] [phone]        Signed: Wende Neighbors 03/30/2012, 4:07 PM

## 2014-01-20 ENCOUNTER — Encounter: Payer: Self-pay | Admitting: Family Medicine

## 2014-01-20 ENCOUNTER — Other Ambulatory Visit: Payer: Self-pay | Admitting: Family Medicine

## 2014-01-20 ENCOUNTER — Ambulatory Visit (INDEPENDENT_AMBULATORY_CARE_PROVIDER_SITE_OTHER): Payer: 59 | Admitting: Family Medicine

## 2014-01-20 VITALS — BP 157/111 | HR 78 | Ht 71.0 in | Wt 188.0 lb

## 2014-01-20 DIAGNOSIS — E119 Type 2 diabetes mellitus without complications: Secondary | ICD-10-CM

## 2014-01-20 DIAGNOSIS — I10 Essential (primary) hypertension: Secondary | ICD-10-CM | POA: Insufficient documentation

## 2014-01-20 DIAGNOSIS — E1165 Type 2 diabetes mellitus with hyperglycemia: Secondary | ICD-10-CM

## 2014-01-20 DIAGNOSIS — H6122 Impacted cerumen, left ear: Secondary | ICD-10-CM

## 2014-01-20 DIAGNOSIS — IMO0002 Reserved for concepts with insufficient information to code with codable children: Secondary | ICD-10-CM | POA: Insufficient documentation

## 2014-01-20 DIAGNOSIS — E1139 Type 2 diabetes mellitus with other diabetic ophthalmic complication: Secondary | ICD-10-CM | POA: Insufficient documentation

## 2014-01-20 MED ORDER — LOSARTAN POTASSIUM-HCTZ 100-25 MG PO TABS: 1.0000 | ORAL_TABLET | Freq: Every day | ORAL | Status: DC

## 2014-01-20 MED ORDER — AMLODIPINE BESYLATE 5 MG PO TABS: 5.0000 mg | ORAL_TABLET | Freq: Every day | ORAL | Status: DC

## 2014-01-20 NOTE — Progress Notes (Signed)
CC: Peter EvesClarence Griffin is a 58 y.o. male is here for Establish Care   Subjective: HPI:  Very pleasant 58 year old here to establish care  [place] [employer] [place]  History of essential hypertension currently taking amlodipine and Hyzaar. He is uncertain how long he's been on this medication but on chart review indicates that at least one year. He tells me that blood pressures at home are always in the normal range. He did not take his blood pressure medication today thinking that he was not allowed to take it while fasting. Nothing particularly makes blood pressure better or worse that he knows of.  History of type 2 diabetes: He tells me he was once on diabetic medication however was able to get off of it by losing weight. His most recent A1c was in June through the [facility] system with a value of 7.1. Going back the past 10 years it looks like his highest value was 8.1 in August 2014.Marland Kitchen. Denies polyuria polyphagia or polydipsia denies poorly healing wounds.  He went to get his hearing aids adjusted earlier this week and was told that this could not be performed due to ear wax in the left ear. 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 He denies any ear discomfort or any ear complaints.   Review of Systems - General ROS: negative for - chills, fever, night sweats, weight gain or weight loss Ophthalmic ROS: negative for - decreased vision Psychological ROS: negative for - anxiety or depression ENT ROS: negative for -  nasal congestion, tinnitus or allergies Hematological and Lymphatic ROS: negative for - bleeding problems, bruising or swollen lymph nodes Breast ROS: negative Respiratory ROS: no cough, shortness of breath, or wheezing Cardiovascular ROS: no chest pain or dyspnea on exertion Gastrointestinal ROS: no abdominal pain, change in bowel habits, or black or bloody stools Genito-Urinary ROS: negative for - genital discharge, genital ulcers, incontinence or  abnormal bleeding from genitals Musculoskeletal ROS: negative for - joint pain or muscle pain Neurological ROS: negative for - headaches or memory loss Dermatological ROS: negative for lumps, mole changes, rash and skin lesion changes  Past Medical History  Diagnosis Date  . Hypertension   . Diabetes mellitus without complication     Past Surgical History  Procedure Laterality Date  . Dental surgery    . Tonsillectomy    . Quadriceps tendon repair Left 03/16/2012    Procedure: REPAIR QUADRICEP TENDON;  Surgeon: Eldred MangesMark C Yates, MD;  Location: [facility] OR;  Service: Orthopedics;  Laterality: Left;   History reviewed. No pertinent family history.  History   Social History  . Marital Status: Married    Spouse Name: N/A    Number of Children: N/A  . Years of Education: N/A   Occupational History  . Not on file.   Social History Main Topics  . Smoking status: Never Smoker   . Smokeless tobacco: Not on file  . Alcohol Use: No  . Drug Use: No  . Sexual Activity:    Partners: Female   Other Topics Concern  . Not on file   Social History Narrative     Objective: BP 157/111 mmHg  Pulse 78  Ht 5\' 11"  (1.803 m)  Wt 188 lb (85.276 kg)  BMI 26.23 kg/m2  General: Alert and Oriented, No Acute Distress HEENT: Pupils equal, round, reactive to light. Conjunctivae clear.  External ears unremarkable, right canal clear with intact TMs with appropriate landmarks left canal initially has cerumen impaction obscuring the  tympanic membrane, after this was removed canal is clear without any abnormalities of the tympanic membrane.  Middle ear appears open without effusion. Pink inferior turbinates.  Moist mucous membranes, pharynx without inflammation nor lesions.  Neck supple without palpable lymphadenopathy nor abnormal masses. Lungs: Clear to auscultation bilaterally, no wheezing/ronchi/rales.  Comfortable work of breathing. Good air movement. Cardiac: Regular rate and rhythm. Normal S1/S2.  No  murmurs, rubs, nor gallops.   Extremities: No peripheral edema.  Strong peripheral pulses.  Mental Status: No depression, anxiety, nor agitation. Skin: Warm and dry.  Assessment & Plan: Peter Griffin was seen today for establish care.  Diagnoses and associated orders for this visit:  Essential hypertension  Type 2 diabetes mellitus without complication  Left ear impacted cerumen    Essential hypertension: Uncontrolled, have asked him to restart taking his blood pressure medication and to call me if blood pressure climbs above 140/90, the next step would be increasing amlodipine. For now continue amlodipine 5 mg and Hyzaar. Checking renal function Type 2 diabetes: Clinically controlled but due for A1c today and fasting blood sugar. Due for lipid panel  He brings in a biometric screening form that needs to be completed for his office, I will complete this on Friday with his labs are available.  Indication: Cerumen impaction of the left ear Medical necessity statement: On physical examination, cerumen impairs clinically significant portions of the external auditory canal, and tympanic membrane. Noted obstructive, copious cerumen that cannot be removed without magnification and instrumentations requiring physician skills Consent: Discussed benefits and risks of procedure and verbal consent obtained Procedure: Patient was prepped for the procedure. Utilized an otoscope to assess and take note of the ear canal, the tympanic membrane, and the presence, amount, and placement of the cerumen. Gentle water irrigation and soft plastic curette was utilized to remove cerumen.  Post procedure examination: shows cerumen was completely removed. Patient tolerated procedure well. The patient is made aware that they may experience temporary vertigo, temporary hearing loss, and temporary discomfort. If these symptom last for more than 24 hours to call the clinic or proceed to the ED.      Return in about 6  months (around 07/22/2014).

## 2014-01-20 NOTE — Patient Instructions (Signed)
Call me if BP greater than 140/90 at home.

## 2014-01-21 ENCOUNTER — Telehealth: Payer: Self-pay | Admitting: Family Medicine

## 2014-01-21 ENCOUNTER — Other Ambulatory Visit: Payer: Self-pay | Admitting: Family Medicine

## 2014-01-21 DIAGNOSIS — E785 Hyperlipidemia, unspecified: Secondary | ICD-10-CM

## 2014-01-21 HISTORY — DX: Hyperlipidemia, unspecified: E78.5

## 2014-01-21 LAB — BASIC METABOLIC PANEL WITH GFR
BUN: 10 mg/dL (ref 6–23)
CHLORIDE: 101 meq/L (ref 96–112)
CO2: 27 mEq/L (ref 19–32)
Calcium: 9.4 mg/dL (ref 8.4–10.5)
Creat: 0.81 mg/dL (ref 0.50–1.35)
GFR, Est African American: >89 mL/min
GFR, Est Non African American: >89 mL/min
GLUCOSE: 194 mg/dL — AB (ref 70–99)
POTASSIUM: 3.8 meq/L (ref 3.5–5.3)
Sodium: 133 mEq/L — ABNORMAL LOW (ref 135–145)

## 2014-01-21 LAB — LIPID PANEL
CHOL/HDL RATIO: 4.4 ratio
Cholesterol: 173 mg/dL (ref 0–200)
HDL: 39 mg/dL — AB (ref >39)
LDL Cholesterol: 106 mg/dL — ABNORMAL HIGH (ref 0–99)
TRIGLYCERIDES: 140 mg/dL (ref <150)
VLDL: 28 mg/dL (ref 0–40)

## 2014-01-21 LAB — HEMOGLOBIN A1C
Hgb A1c MFr Bld: 7.7 % — ABNORMAL HIGH (ref <5.7)
Mean Plasma Glucose: 174 mg/dL — ABNORMAL HIGH (ref <117)

## 2014-01-21 MED ORDER — ATORVASTATIN CALCIUM 20 MG PO TABS: 20.0000 mg | ORAL_TABLET | Freq: Every day | ORAL | Status: DC

## 2014-01-21 MED ORDER — METFORMIN HCL 1000 MG PO TABS: ORAL_TABLET | ORAL | Status: DC

## 2014-01-21 NOTE — Telephone Encounter (Signed)
Pt notified. Form up front

## 2014-01-21 NOTE — Telephone Encounter (Signed)
Sue Lushndrea, Will you please let patient know that his A1c was 7.7 with a goal of less than 7 for optimal blood sugar control.  I'd recommend he start a single dose of metformin daily to help with this.  Also, his cholesterol was elevated to a degree that I would recommend starting a cholesterol lowering medication called atorvastatin.  I've sent both of these to his [facility] pharmacy.  I'd recommend a f/u visit to check on these conditions in three months.  (biometric form in your inbox for pickup)

## 2014-04-20 ENCOUNTER — Ambulatory Visit (INDEPENDENT_AMBULATORY_CARE_PROVIDER_SITE_OTHER): Payer: 59 | Admitting: Family Medicine

## 2014-04-20 ENCOUNTER — Other Ambulatory Visit: Payer: Self-pay | Admitting: Family Medicine

## 2014-04-20 ENCOUNTER — Encounter: Payer: Self-pay | Admitting: Family Medicine

## 2014-04-20 VITALS — BP 147/93 | HR 93 | Wt 185.0 lb

## 2014-04-20 DIAGNOSIS — E119 Type 2 diabetes mellitus without complications: Secondary | ICD-10-CM

## 2014-04-20 DIAGNOSIS — I10 Essential (primary) hypertension: Secondary | ICD-10-CM | POA: Diagnosis not present

## 2014-04-20 DIAGNOSIS — E785 Hyperlipidemia, unspecified: Secondary | ICD-10-CM | POA: Diagnosis not present

## 2014-04-20 DIAGNOSIS — M545 Low back pain: Secondary | ICD-10-CM

## 2014-04-20 LAB — POCT GLYCOSYLATED HEMOGLOBIN (HGB A1C): Hemoglobin A1C: 7.1

## 2014-04-20 MED ORDER — MELOXICAM 15 MG PO TABS: 15.0000 mg | ORAL_TABLET | Freq: Every day | ORAL | Status: DC

## 2014-04-20 MED ORDER — AMLODIPINE BESYLATE 5 MG PO TABS: 5.0000 mg | ORAL_TABLET | Freq: Every day | ORAL | Status: DC

## 2014-04-20 MED ORDER — LOSARTAN POTASSIUM-HCTZ 100-25 MG PO TABS: 1.0000 | ORAL_TABLET | Freq: Every day | ORAL | Status: DC

## 2014-04-20 MED ORDER — METFORMIN HCL 1000 MG PO TABS: ORAL_TABLET | ORAL | Status: DC

## 2014-04-20 NOTE — Progress Notes (Signed)
CC: Peter Griffin is a 59 y.o. male is here for Diabetes   Subjective: HPI:  Follow-up type 2 diabetes: Since I saw him last began taking metformin 1 g every evening. He's noticed that he is not urinating as much in the middle of the night. He denies any other known side effects or intolerance. No outside blood sugars to report. Denies polyuria or polyphasia or polydipsia. No hypoglycemic episodes.  Essential hypertension: Continues to take amlodipine and Hyzaar on a daily basis without known side effects. No outside blood pressures to report. He did not take either medication today because he thought he needed to be fasting and could not take this medication. No chest pain shortness of breath orthopnea nor peripheral edema  Follow-up hyperlipidemia: He started taking atorvastatin after his last visit and has not experienced any myalgias or right upper quadrant pain.  He complains of left low back pain that feels a dull and slightly radiates down the back of the left leg ending at the middle of the posterior thigh. It's absent if he is up and moving around. It is only present after he has been stationary such as sitting for more than a few minutes and then tries to get up. It's mild in severity has been present for 2 weeks has not been getting better or worse, no trauma just prior to the onset of this pain. Denies any motor or sensory disturbances nor skin changes overlying the site of discomfort. No interventions as of yet    Review Of Systems Outlined In HPI  Past Medical History  Diagnosis Date  . Hypertension   . Diabetes mellitus without complication     Past Surgical History  Procedure Laterality Date  . Dental surgery    . Tonsillectomy    . Quadriceps tendon repair Left 03/16/2012    Procedure: REPAIR QUADRICEP TENDON;  Surgeon: Eldred Manges, MD;  Location: [facility] OR;  Service: Orthopedics;  Laterality: Left;   No family history on file.  History   Social History  . Marital  Status: Married    Spouse Name: N/A  . Number of Children: N/A  . Years of Education: N/A   Occupational History  . Not on file.   Social History Main Topics  . Smoking status: Never Smoker   . Smokeless tobacco: Not on file  . Alcohol Use: No  . Drug Use: No  . Sexual Activity:    Partners: Female   Other Topics Concern  . Not on file   Social History Narrative     Objective: BP 147/93 mmHg  Pulse 93  Wt 185 lb (83.915 kg)  SpO2 100%  General: Alert and Oriented, No Acute Distress HEENT: Pupils equal, round, reactive to light. Conjunctivae clear. Moist mucous membranes  Lungs: Clear to auscultation bilaterally, no wheezing/ronchi/rales.  Comfortable work of breathing. Good air movement. Cardiac: Regular rate and rhythm. Normal S1/S2.  No murmurs, rubs, nor gallops.No carotid bruit  Back: No midline spinous process tenderness. Slight reproduction of pain with palpation of left paraspinal musculature in the lumbar region just above the brim of the pelvis. Full range of motion and strength in both lower extremities  Extremities: No peripheral edema.  Strong peripheral pulses.  Mental Status: No depression, anxiety, nor agitation. Skin: Warm and dry.  Assessment & Plan: Zeek was seen today for diabetes.  Diagnoses and all orders for this visit:  Essential hypertension  Type 2 diabetes mellitus without complication Orders: -     POCT  HgB A1C  Hyperlipidemia  Low back pain, unspecified back pain laterality, with sciatica presence unspecified Orders: -     meloxicam (MOBIC) 15 MG tablet; Take 1 tablet (15 mg total) by mouth daily. For back pain.  Other orders -     metFORMIN (GLUCOPHAGE) 1000 MG tablet; One tablet by mouth every morning and evening for blood sugar control. -     amLODipine (NORVASC) 5 MG tablet; Take 1 tablet (5 mg total) by mouth daily. -     losartan-hydrochlorothiazide (HYZAAR) 100-25 MG per tablet; Take 1 tablet by mouth daily.   Essential  hypertension: Uncontrolled however he did not take Hyzaar this morning. I've asked him to call me if he has any blood pressures above 140/90 outside of our office so I can adjust his amlodipine. I've encouraged him to take all medications prior to his next visit and he does not need to be fasting at this follow-up in 3 months. Type 2 diabetes: A1c of 7.1, improved but still uncontrolled increasing metformin to a full gram twice a day. Hyperlipidemia: Continue atorvastatin  low back pain: Suspect muscular strain therefore start as needed meloxicam and encouraged to work on range of motion exercises.    Return in about 3 months (around 07/21/2014) for Blood Sugar.

## 2014-04-21 LAB — BASIC METABOLIC PANEL
BUN: 10 mg/dL (ref 4–21)
Creatinine: 0.5 mg/dL — AB (ref 0.6–1.3)
GLUCOSE: 97 mg/dL
Potassium: 4.3 mmol/L (ref 3.4–5.3)
SODIUM: 143 mmol/L (ref 137–147)

## 2014-05-11 ENCOUNTER — Encounter: Payer: Self-pay | Admitting: *Deleted

## 2014-05-11 ENCOUNTER — Emergency Department (INDEPENDENT_AMBULATORY_CARE_PROVIDER_SITE_OTHER)
Admission: EM | Admit: 2014-05-11 | Discharge: 2014-05-11 | Disposition: A | Discharge status: Home / Self Care | Payer: 59 | Source: Home / Self Care | Attending: Family Medicine | Admitting: Family Medicine

## 2014-05-11 DIAGNOSIS — J069 Acute upper respiratory infection, unspecified: Secondary | ICD-10-CM | POA: Diagnosis not present

## 2014-05-11 DIAGNOSIS — B9789 Other viral agents as the cause of diseases classified elsewhere: Principal | ICD-10-CM

## 2014-05-11 MED ORDER — AMOXICILLIN 875 MG PO TABS: 875.0000 mg | ORAL_TABLET | Freq: Two times a day (BID) | ORAL | Status: DC

## 2014-05-11 MED ORDER — BENZONATATE 200 MG PO CAPS: 200.0000 mg | ORAL_CAPSULE | Freq: Every day | ORAL | Status: DC

## 2014-05-11 NOTE — ED Notes (Signed)
Pt c/o nasal congestion, productive cough, and sinus pain intermittently x 1 wk. Denies fever. Taking Allegra and Mucinex.

## 2014-05-11 NOTE — ED Provider Notes (Signed)
CSN: [id number]     Arrival date & time 05/11/14  1844 History   First MD Initiated Contact with Patient 05/11/14 1906     Chief Complaint  Patient presents with  . Nasal Congestion  . Cough      HPI Comments: Patient complains of five day history of typical cold-like symptoms including mild sore throat, sinus congestion, hoarseness, fatigue, and cough.  No fevers, chills, and sweats   The history is provided by the patient.    Past Medical History  Diagnosis Date  . Hypertension   . Diabetes mellitus without complication    Past Surgical History  Procedure Laterality Date  . Dental surgery    . Tonsillectomy    . Quadriceps tendon repair Left 03/16/2012    Procedure: REPAIR QUADRICEP TENDON;  Surgeon: Eldred MangesMark C Yates, MD;  [facility] OR;  Service: Orthopedics;  Laterality: Left;   History reviewed. No pertinent family history. History  Substance Use Topics  . Smoking status: Never Smoker   . Smokeless tobacco: Not on file  . Alcohol Use: No    Review of Systems + sore throat + hoarse + cough No pleuritic pain No wheezing + nasal congestion + post-nasal drainage No sinus pain/pressure No itchy/red eyes No earache No hemoptysis No SOB No fever/chills No nausea No vomiting No abdominal pain No diarrhea No urinary symptoms No skin rash + fatigue No myalgias No headache Used OTC meds without relief  Allergies  Review of patient's allergies indicates no known allergies.  Home Medications   Prior to Admission medications   Medication Sig Start Date End Date Taking? Authorizing Provider  amLODipine (NORVASC) 5 MG tablet Take 1 tablet (5 mg total) by mouth daily. 04/20/14   Sean Hommel, DO  amoxicillin (AMOXIL) 875 MG tablet Take 1 tablet (875 mg total) by mouth 2 (two) times daily. (Rx void after 05/19/14) 05/11/14   Lattie HawStephen A Beese, MD  aspirin 81 MG tablet Take 81 mg by mouth daily.    Historical Provider, MD  atorvastatin (LIPITOR) 20 MG tablet Take 1 tablet  (20 mg total) by mouth daily. 01/21/14   Sean Hommel, DO  benzonatate (TESSALON) 200 MG capsule Take 1 capsule (200 mg total) by mouth at bedtime. Take as needed for cough 05/11/14   Lattie HawStephen A Beese, MD  KRILL OIL PO Take 350 mg by mouth.    Historical Provider, MD  losartan-hydrochlorothiazide (HYZAAR) 100-25 MG per tablet Take 1 tablet by mouth daily. 04/20/14   Sean Hommel, DO  Lutein 6 MG CAPS Take by mouth.    Historical Provider, MD  meloxicam (MOBIC) 15 MG tablet Take 1 tablet (15 mg total) by mouth daily. For back pain. 04/20/14   Sean Hommel, DO  metFORMIN (GLUCOPHAGE) 1000 MG tablet One tablet by mouth every morning and evening for blood sugar control. 04/20/14 04/20/15  Sean Hommel, DO  multivitamin-lutein (OCUVITE-LUTEIN) CAPS Take 1 capsule by mouth daily.    Historical Provider, MD   BP 146/95 mmHg  Pulse 89  Temp(Src) 98.4 F (36.9 C) (Oral)  Resp 18  Ht 5\' 11"  (1.803 m)  Wt 188 lb (85.276 kg)  BMI 26.23 kg/m2  SpO2 99% Physical Exam Nursing notes and Vital Signs reviewed. Appearance:  Patient appears stated age, and in no acute distress Eyes:  Pupils are equal, round, and reactive to light and accomodation.  Extraocular movement is intact.  Conjunctivae are not inflamed  Ears:  Canals normal.  Tympanic membranes normal.  Nose:  Mildly  congested turbinates.  No sinus tenderness.    Pharynx:  Normal Neck:  Supple.   Enlarged tender posterior nodes are palpated bilaterally  Lungs:  Clear to auscultation.  Breath sounds are equal.  Heart:  Regular rate and rhythm without murmurs, rubs, or gallops.  Abdomen:  Nontender without masses or hepatosplenomegaly.  Bowel sounds are present.  No CVA or flank tenderness.  Extremities:  No edema.  No calf tenderness Skin:  No rash present.   ED Course  Procedures  none  MDM   1. Viral URI with cough    There is no evidence of bacterial infection today.  Treat symptomatically for now  Prescription written for Benzonatate (Tessalon) to  take at bedtime for night-time cough.  Take plain guaifenesin (  extended release tabs such as Mucinex) twice daily, with plenty of water, for cough and congestion.  Get adequate rest.   May use Afrin nasal spray (or generic oxymetazoline) twice daily for about 5 days.  Also recommend using saline nasal spray several times daily and saline nasal irrigation (AYR is a common brand).     Stop all antihistamines for now, and other non-prescription cough/cold preparations. Begin Amoxicillin if not improving about one week or if persistent fever develops (Given a prescription to hold, with an expiration date)  Follow-up with family doctor if not improving about10 days.    Lattie Haw, MD 05/13/14 0830

## 2014-05-11 NOTE — Discharge Instructions (Signed)
Take plain guaifenesin (1200mg  extended release tabs such as Mucinex) twice daily, with plenty of water, for cough and congestion.  Get adequate rest.   May use Afrin nasal spray (or generic oxymetazoline) twice daily for about 5 days.  Also recommend using saline nasal spray several times daily and saline nasal irrigation (AYR is a common brand).     Stop all antihistamines for now, and other non-prescription cough/cold preparations. Begin Amoxicillin if not improving about one week or if persistent fever develops   Follow-up with family doctor if not improving about10 days.

## 2014-05-26 ENCOUNTER — Encounter: Payer: Self-pay | Admitting: Family Medicine

## 2014-05-26 LAB — CHLORIDE

## 2014-07-21 ENCOUNTER — Other Ambulatory Visit: Payer: Self-pay | Admitting: Family Medicine

## 2014-07-21 ENCOUNTER — Encounter: Payer: Self-pay | Admitting: Family Medicine

## 2014-07-21 ENCOUNTER — Ambulatory Visit (INDEPENDENT_AMBULATORY_CARE_PROVIDER_SITE_OTHER): Payer: 59 | Admitting: Family Medicine

## 2014-07-21 VITALS — BP 135/93 | HR 98 | Ht 71.0 in | Wt 189.0 lb

## 2014-07-21 DIAGNOSIS — E119 Type 2 diabetes mellitus without complications: Secondary | ICD-10-CM | POA: Diagnosis not present

## 2014-07-21 DIAGNOSIS — B070 Plantar wart: Secondary | ICD-10-CM | POA: Diagnosis not present

## 2014-07-21 MED ORDER — AMLODIPINE BESYLATE 5 MG PO TABS: 5.0000 mg | ORAL_TABLET | Freq: Every day | ORAL | Status: DC

## 2014-07-21 MED ORDER — GLYBURIDE-METFORMIN 2.5-500 MG PO TABS: 2.0000 | ORAL_TABLET | Freq: Every day | ORAL | Status: DC

## 2014-07-21 MED ORDER — LOSARTAN POTASSIUM-HCTZ 100-25 MG PO TABS: 1.0000 | ORAL_TABLET | Freq: Every day | ORAL | Status: DC

## 2014-07-21 NOTE — Progress Notes (Signed)
CC: Peter Griffin is a 59 y.o. male is here for Follow-up   Subjective: HPI:  Follow-up type II diabetes: His only been taking 1 g of metformin daily. When he took it twice a day he had intolerable diarrhea. Outside blood sugars to report. Trying to cut back on carbohydrates. No polyuria or polyphagia or polydipsia.  Complains of right foot pain localized in the lateral plantar surface of the foot. It's absent when not bearing weight. Feels sharp almost like a rock stuck to the bottom of his foot whenever bearing weight. Symptoms have been getting worse over the past 4 weeks and began about 2 months ago. No interventions as of yet. He denies any overlying skin changes or joint pain elsewhere. Denies any trauma   Review Of Systems Outlined In HPI  Past Medical History  Diagnosis Date  . Hypertension   . Diabetes mellitus without complication     Past Surgical History  Procedure Laterality Date  . Dental surgery    . Tonsillectomy    . Quadriceps tendon repair Left 03/16/2012    Procedure: REPAIR QUADRICEP TENDON;  Surgeon: Eldred Manges, MD;  Location: [facility] OR;  Service: Orthopedics;  Laterality: Left;   No family history on file.  History   Social History  . Marital Status: Married    Spouse Name: N/A  . Number of Children: N/A  . Years of Education: N/A   Occupational History  . Not on file.   Social History Main Topics  . Smoking status: Never Smoker   . Smokeless tobacco: Not on file  . Alcohol Use: No  . Drug Use: No  . Sexual Activity:    Partners: Female   Other Topics Concern  . Not on file   Social History Narrative     Objective: BP 135/93 mmHg  Pulse 98  Ht 5\' 11"  (1.803 m)  Wt 189 lb (85.73 kg)  BMI 26.37 kg/m2  General: Alert and Oriented, No Acute Distress HEENT: Pupils equal, round, reactive to light. Conjunctivae clear.  Moist pedis membranes Lungs: Clear to auscultation bilaterally, no wheezing/ronchi/rales.  Comfortable work of breathing.  Good air movement. Cardiac: Regular rate and rhythm. Normal S1/S2.  No murmurs, rubs, nor gallops.   Extremities: No peripheral edema.  Strong peripheral pulses.  Mental Status: No depression, anxiety, nor agitation. Skin: Warm and dry. On the bottom of the right foot just proximal to the fifth toe there is a 1 cm plantar's wart.  Assessment & Plan: Peter Griffin was seen today for follow-up.  Diagnoses and all orders for this visit:  Type 2 diabetes mellitus without complication Orders: -     POCT glycosylated hemoglobin (Hb A1C)  Plantar wart  Other orders -     glyBURIDE-metformin (GLUCOVANCE) 2.5-500 MG per tablet; Take 2 tablets by mouth at bedtime. -     losartan-hydrochlorothiazide (HYZAAR) 100-25 MG per tablet; Take 1 tablet by mouth daily. -     amLODipine (NORVASC) 5 MG tablet; Take 1 tablet (5 mg total) by mouth daily.   Type 2 diabetes: A1c 7.1, adding glyburide to his metformin regimen. Stop the 1 g metformin tablet and begin Glucovance daily. Time was taken to shave down his plantar work with the scalpel after sterilizing the skin with chlorhexidine.  Callus overlying the wart was successfully removed without complication and patient was pain-free after the procedure.  25 minutes spent face-to-face during visit today of which at least 50% was counseling or coordinating care regarding: 1. Type 2  diabetes mellitus without complication   2. Plantar wart      Return in about 3 months (around 10/21/2014).

## 2014-07-22 ENCOUNTER — Telehealth: Payer: Self-pay | Admitting: *Deleted

## 2014-07-22 ENCOUNTER — Ambulatory Visit: Payer: Self-pay | Admitting: Family Medicine

## 2014-07-22 MED ORDER — PIOGLITAZONE HCL 15 MG PO TABS: 15.0000 mg | ORAL_TABLET | Freq: Every day | ORAL | Status: DC

## 2014-07-22 MED ORDER — METFORMIN HCL 1000 MG PO TABS: 1000.0000 mg | ORAL_TABLET | Freq: Every day | ORAL | Status: DC

## 2014-07-22 NOTE — Telephone Encounter (Signed)
Pt called today to let you know that he had a negative reaction to the new diabetes med you started him on yesterday.  What he said happened was he woke up last night all dizzy and weak.  Around 2am his BP had spiked up to 188/110.  He stated that the dizziness lasted from around 1-3am.  He said he feels ok now and his BP is back to normal at 118/88.  I told him that I would advise you of his concerns.

## 2014-07-22 NOTE — Telephone Encounter (Signed)
LMOM notifying pt of instructions and of new rx.

## 2014-07-22 NOTE — Telephone Encounter (Signed)
Stop the new glyburide-metformin Rx and restart taking the 1000mg  meformin.  I would also recommend that he take pioglitazone which is another diabetes medication that I've sent to his [facility]. F/u three months.

## 2014-10-05 ENCOUNTER — Encounter: Payer: Self-pay | Admitting: *Deleted

## 2014-10-07 ENCOUNTER — Encounter: Payer: Self-pay | Admitting: Family Medicine

## 2014-10-07 DIAGNOSIS — I213 ST elevation (STEMI) myocardial infarction of unspecified site: Secondary | ICD-10-CM | POA: Insufficient documentation

## 2014-10-19 ENCOUNTER — Telehealth: Payer: Self-pay | Admitting: Emergency Medicine

## 2014-10-19 NOTE — Telephone Encounter (Signed)
Agreed, I don't have much experience with angioplasty.

## 2014-10-19 NOTE — Telephone Encounter (Signed)
Wife of patient calls with concern about patient's bruising from angioplasty procedure done for 3rd blockage 10-12-14; after review of record that does not include any info from his MI on 09/24/14 and subsequent stents x3, I encourage her to contact cardiologist today and have husband seen today. He does have appt. with Dr.Hommel 10/21/14 and I asked to bring all of his records on that date.

## 2014-10-21 ENCOUNTER — Encounter: Payer: Self-pay | Admitting: Family Medicine

## 2014-10-21 ENCOUNTER — Ambulatory Visit (INDEPENDENT_AMBULATORY_CARE_PROVIDER_SITE_OTHER): Payer: BLUE CROSS/BLUE SHIELD | Admitting: Family Medicine

## 2014-10-21 VITALS — BP 133/85 | HR 96 | Wt 182.0 lb

## 2014-10-21 DIAGNOSIS — I10 Essential (primary) hypertension: Secondary | ICD-10-CM

## 2014-10-21 DIAGNOSIS — I213 ST elevation (STEMI) myocardial infarction of unspecified site: Secondary | ICD-10-CM

## 2014-10-21 DIAGNOSIS — E119 Type 2 diabetes mellitus without complications: Secondary | ICD-10-CM

## 2014-10-21 DIAGNOSIS — I255 Ischemic cardiomyopathy: Secondary | ICD-10-CM | POA: Diagnosis not present

## 2014-10-21 DIAGNOSIS — T148XXA Other injury of unspecified body region, initial encounter: Secondary | ICD-10-CM

## 2014-10-21 HISTORY — DX: Ischemic cardiomyopathy: I25.5

## 2014-10-21 LAB — CBC
HCT: 40.5 % (ref 39.0–52.0)
Hemoglobin: 13.3 g/dL (ref 13.0–17.0)
MCH: 28.3 pg (ref 26.0–34.0)
MCHC: 32.8 g/dL (ref 30.0–36.0)
MCV: 86.2 fL (ref 78.0–100.0)
MPV: 11.7 fL (ref 8.6–12.4)
PLATELETS: 243 10*3/uL (ref 150–400)
RBC: 4.7 MIL/uL (ref 4.22–5.81)
RDW: 13.7 % (ref 11.5–15.5)
WBC: 5.4 10*3/uL (ref 4.0–10.5)

## 2014-10-21 NOTE — Progress Notes (Signed)
CC: Peter Griffin is a 59 y.o. male is here for Follow-up   Subjective: HPI:  In late August he was awoken from sleep with chest pain and numbness in both arms. He broke into a cold sweat and called EMS. He was transported to a local emergency room and found to have an ST elevation myocardial infarction. He received 2 stents during that hospitalization and was sent home to return in a few weeks for further stenting. There were multiple medication adjustments during this hospitalization and on follow-up. Both episodes of stenting were successful without palpitations other than a mild hematoma on the second catheterization that occurred earlier this week. He reports compliance with metoprolol, lisinopril, high-dose atorvastatin, brilinta, and aspirn. He denies any chest pain or motor or sensory disturbances since his hospitalization other than that described below. He tells me he feels pretty good and is looking forward to going back to work once cardiology clears him. No outside blood sugars to report. He was started on glipizide and metformin containing medication however the night after he took it he woke up in a cold sweat with a tremor. He was suspicious that it was the glipizide medication and stopped indefinitely and has not had any return of symptoms.   Denies chest pain shortness of breath orthopnea or peripheral edema, irregular heartbeat, lightheadedness, or fatigue. No polyuria polyphagia or polydipsia  Past Medical History  Diagnosis Date  . Hypertension   . Diabetes mellitus without complication     Past Surgical History  Procedure Laterality Date  . Dental surgery    . Tonsillectomy    . Quadriceps tendon repair Left 03/16/2012    Procedure: REPAIR QUADRICEP TENDON;  Surgeon: Peter Manges, MD;  Location: [facility] OR;  Service: Orthopedics;  Laterality: Left;   No family history on file.  Social History   Social History  . Marital Status: Married    Spouse Name: N/A  . Number of  Children: N/A  . Years of Education: N/A   Occupational History  . Not on file.   Social History Main Topics  . Smoking status: Never Smoker   . Smokeless tobacco: Not on file  . Alcohol Use: No  . Drug Use: No  . Sexual Activity:    Partners: Female   Other Topics Concern  . Not on file   Social History Narrative     Objective: BP 133/85 mmHg  Pulse 96  Wt 182 lb (82.555 kg)  General: Alert and Oriented, No Acute Distress HEENT: Pupils equal, round, reactive to light. Conjunctivae clear.  Moist mucous membranes Lungs: Clear to auscultation bilaterally, no wheezing/ronchi/rales.  Comfortable work of breathing. Good air movement. Cardiac: Regular rate and rhythm. Normal S1/S2.  No murmurs, rubs, nor gallops.   Abdomen: Normal bowel sounds, soft and non tender without palpable masses. Extremities: No peripheral edema.  Strong peripheral pulses. Pale ecchymosis in the right medial thigh  Mental Status: No depression, anxiety, nor agitation. Skin: Warm and dry.  Assessment & Plan: Peter Griffin was seen today for follow-up.  Diagnoses and all orders for this visit:  Ischemic cardiomyopathy  Type 2 diabetes mellitus without complication -     Hemoglobin A1c  Essential hypertension  ST elevation myocardial infarction (STEMI), unspecified artery  Hematoma -     CBC   Ischemic cardiomyopathy: A symptomatic right now, discussed the importance of taking metoprolol and lisinopril to help this heal. Type 2 diabetes: Agree with his decision to stop glipizide combo medication, additionally I noted  that he should not be on more than 2000 mg metformin in a 24-hour period. Essential hypertension: Controlled continue lisinopril and metoprolol ST elevation myocardial infarction: Discussed importance of taking aspirin and brilinta provided no bleeding complications Hematoma: He tells me is looking a little bit better and not causing any pain, checking a CBC to look for the degree of  hemoglobin drop and determine if he needs to be on an iron  Supplement  25 minutes spent face-to-face during visit today of which at least 50% was counseling or coordinating care regarding: 1. Ischemic cardiomyopathy   2. Type 2 diabetes mellitus without complication   3. Essential hypertension   4. ST elevation myocardial infarction (STEMI), unspecified artery   5. Hematoma      Return in about 3 months (around 01/20/2015).

## 2014-10-22 LAB — HEMOGLOBIN A1C
HEMOGLOBIN A1C: 6.5 % — AB (ref <5.7)
MEAN PLASMA GLUCOSE: 140 mg/dL — AB (ref <117)

## 2014-11-21 ENCOUNTER — Encounter: Payer: Self-pay | Admitting: *Deleted

## 2014-11-21 ENCOUNTER — Emergency Department
Admission: EM | Admit: 2014-11-21 | Discharge: 2014-11-21 | Disposition: A | Discharge status: Home / Self Care | Payer: BLUE CROSS/BLUE SHIELD | Source: Home / Self Care | Attending: Family Medicine | Admitting: Family Medicine

## 2014-11-21 ENCOUNTER — Telehealth: Payer: Self-pay | Admitting: Family Medicine

## 2014-11-21 ENCOUNTER — Ambulatory Visit (INDEPENDENT_AMBULATORY_CARE_PROVIDER_SITE_OTHER): Payer: BLUE CROSS/BLUE SHIELD

## 2014-11-21 DIAGNOSIS — N289 Disorder of kidney and ureter, unspecified: Secondary | ICD-10-CM

## 2014-11-21 DIAGNOSIS — R1032 Left lower quadrant pain: Secondary | ICD-10-CM

## 2014-11-21 DIAGNOSIS — J90 Pleural effusion, not elsewhere classified: Secondary | ICD-10-CM

## 2014-11-21 DIAGNOSIS — K5721 Diverticulitis of large intestine with perforation and abscess with bleeding: Secondary | ICD-10-CM | POA: Diagnosis not present

## 2014-11-21 DIAGNOSIS — K5732 Diverticulitis of large intestine without perforation or abscess without bleeding: Secondary | ICD-10-CM | POA: Diagnosis not present

## 2014-11-21 LAB — POCT URINALYSIS DIP (MANUAL ENTRY)
Bilirubin, UA: NEGATIVE
Blood, UA: NEGATIVE
Glucose, UA: NEGATIVE
Ketones, POC UA: NEGATIVE
Leukocytes, UA: NEGATIVE
Nitrite, UA: NEGATIVE
Protein Ur, POC: NEGATIVE
Spec Grav, UA: 1.01 (ref 1.005–1.03)
Urobilinogen, UA: 0.2 (ref 0–1)
pH, UA: 7 (ref 5–8)

## 2014-11-21 LAB — COMPLETE METABOLIC PANEL WITH GFR
ALT: 13 U/L (ref 9–46)
AST: 17 U/L (ref 10–35)
Albumin: 4.1 g/dL (ref 3.6–5.1)
Alkaline Phosphatase: 110 U/L (ref 40–115)
BUN: 15 mg/dL (ref 7–25)
CO2: 25 mmol/L (ref 20–31)
Calcium: 9.1 mg/dL (ref 8.6–10.3)
Chloride: 104 mmol/L (ref 98–110)
Creat: 0.69 mg/dL — ABNORMAL LOW (ref 0.70–1.33)
GFR, Est African American: >89 mL/min (ref >=60)
GFR, Est Non African American: >89 mL/min (ref >=60)
Glucose, Bld: 105 mg/dL — ABNORMAL HIGH (ref 65–99)
Potassium: 5.1 mmol/L (ref 3.5–5.3)
Sodium: 139 mmol/L (ref 135–146)
Total Bilirubin: 0.7 mg/dL (ref 0.2–1.2)
Total Protein: 5.8 g/dL — ABNORMAL LOW (ref 6.1–8.1)

## 2014-11-21 LAB — POCT CBC W AUTO DIFF (K'VILLE URGENT CARE)

## 2014-11-21 MED ORDER — HYDROCODONE-ACETAMINOPHEN 5-325 MG PO TABS: 1.0000 | ORAL_TABLET | Freq: Four times a day (QID) | ORAL | Status: DC | PRN

## 2014-11-21 MED ORDER — IOHEXOL 300 MG/ML  SOLN
100.0000 mL | Freq: Once | INTRAMUSCULAR | Status: AC | PRN
  Administered 2014-11-21: 100 mL via INTRAVENOUS

## 2014-11-21 MED ORDER — AMOXICILLIN-POT CLAVULANATE 875-125 MG PO TABS: 1.0000 | ORAL_TABLET | Freq: Two times a day (BID) | ORAL | Status: DC

## 2014-11-21 NOTE — ED Provider Notes (Signed)
CSN: [id number]     Arrival date & time 11/21/14  4098 History   First MD Initiated Contact with Patient 11/21/14 [phone]     Chief Complaint  Patient presents with  . Abdominal Pain   (Consider location/radiation/quality/duration/timing/severity/associated sxs/prior Treatment) HPI  Pt is a 59yo male presenting to [facility] with c/o sudden onset Left lower abdominal pain that started last night.  Pain is sharp in nature and woke him up but resolved on its own within a few minutes.  Pain is 2/10 at this time, cramping and sharp. Denies fever, chills, n/v/d. Denies dysuria but does report mild Left flank pain.  Denies hx of bladder infections or kidney stones. Denies scrotal pain or swelling. Denies hx of hernias or diverticulitis. Denies hx of abdominal surgeries. Denies known injuries.  Past Medical History  Diagnosis Date  . Hypertension   . Diabetes mellitus without complication [facility])    Past Surgical History  Procedure Laterality Date  . Dental surgery    . Tonsillectomy    . Quadriceps tendon repair Left 03/16/2012    Procedure: REPAIR QUADRICEP TENDON;  Surgeon: Eldred Manges, MD;  Location: [facility] OR;  Service: Orthopedics;  Laterality: Left;  . Cardiac surgery     History reviewed. No pertinent family history. Social History  Substance Use Topics  . Smoking status: Never Smoker   . Smokeless tobacco: Never Used  . Alcohol Use: No    Review of Systems  Constitutional: Negative for fever and chills.  HENT: Negative for congestion, ear pain, sore throat, trouble swallowing and voice change.   Respiratory: Negative for cough and shortness of breath.   Cardiovascular: Negative for chest pain and palpitations.  Gastrointestinal: Positive for abdominal pain. Negative for nausea, vomiting and diarrhea.  Genitourinary: Positive for flank pain ( mild Left). Negative for dysuria, urgency, frequency, hematuria, decreased urine volume, discharge, penile swelling, scrotal swelling, penile pain and  testicular pain.  Musculoskeletal: Negative for myalgias, back pain and arthralgias.  Skin: Negative for rash.  All other systems reviewed and are negative.   Allergies  Review of patient's allergies indicates no known allergies.  Home Medications   Prior to Admission medications   Medication Sig Start Date End Date Taking? Authorizing Provider  amoxicillin-clavulanate (AUGMENTIN) 875-125 MG tablet Take 1 tablet by mouth 2 (two) times daily. One po bid x 7 days 11/21/14   Junius Finner, PA-C  aspirin 81 MG tablet Take 81 mg by mouth daily.    Historical Provider, MD  atorvastatin (LIPITOR) 80 MG tablet Take 1 tablet (80 mg total) by mouth daily. 10/21/14   Laren Boom, DO  HYDROcodone-acetaminophen (NORCO/VICODIN) 5-325 MG tablet Take 1 tablet by mouth every 6 (six) hours as needed for moderate pain or severe pain. 11/21/14   Junius Finner, PA-C  KRILL OIL PO Take 350 mg by mouth.    Historical Provider, MD  lisinopril (ZESTRIL) 2.5 MG tablet Take 1 tablet (2.5 mg total) by mouth daily. 10/21/14   Sean Hommel, DO  Lutein 6 MG CAPS Take by mouth.    Historical Provider, MD  metFORMIN (GLUCOPHAGE) 1000 MG tablet Take 1 tablet (1,000 mg total) by mouth 2 (two) times daily. 10/21/14   Laren Boom, DO  metoprolol succinate (TOPROL-XL) 50 MG 24 hr tablet Take 2 tablets (100 mg total) by mouth daily. Take with or immediately following a meal. 10/21/14   Sean Hommel, DO  multivitamin-lutein (OCUVITE-LUTEIN) CAPS Take 1 capsule by mouth daily.    Historical Provider, MD  ticagrelor (BRILINTA) 90 MG TABS tablet Take 1 tablet (90 mg total) by mouth 2 (two) times daily. 10/21/14   Laren BoomSean Hommel, DO   Meds Ordered and Administered this Visit  Medications - No data to display  BP 138/93 mmHg  Pulse 88  Temp(Src) 98.2 F (36.8 C) (Oral)  Resp 16  Wt 177 lb (80.287 kg)  SpO2 98% No data found.   Physical Exam  Constitutional: He appears well-developed and well-nourished.  HENT:  Head: Normocephalic  and atraumatic.  Eyes: Conjunctivae are normal. No scleral icterus.  Neck: Normal range of motion.  Cardiovascular: Normal rate, regular rhythm and normal heart sounds.   Pulmonary/Chest: Effort normal. No respiratory distress. He has decreased breath sounds in the right lower field and the left lower field. He has no wheezes. He has no rales. He exhibits no tenderness.  Abdominal: Soft. Bowel sounds are normal. He exhibits no distension and no mass. There is tenderness in the left lower quadrant. There is guarding and CVA tenderness ( mild, Left). There is no rebound.    Soft, non-distended. Tenderness in Left lower quadrant with voluntary guarding, no rebound. No masses palpated.  Musculoskeletal: Normal range of motion.  Neurological: He is alert.  Skin: Skin is warm and dry.  Nursing note and vitals reviewed.   ED Course  Procedures (including critical care time)  Labs Review Labs Reviewed  COMPLETE METABOLIC PANEL WITH GFR  POCT URINALYSIS DIP (MANUAL ENTRY)  POCT CBC W AUTO DIFF ([facility])    Imaging Review Ct Abdomen Pelvis W Contrast  11/21/2014  CLINICAL DATA:  Left lower quadrant pain EXAM: CT ABDOMEN AND PELVIS WITH CONTRAST TECHNIQUE: Multidetector CT imaging of the abdomen and pelvis was performed using the standard protocol following bolus administration of intravenous contrast. CONTRAST:  100mL OMNIPAQUE IOHEXOL 300 MG/ML  SOLN COMPARISON:  None. FINDINGS: Lower chest: Partially visualized moderate to large bilateral pleural effusions. Small pericardial effusion. Hepatobiliary: Normal Pancreas: Normal Spleen: Normal Adrenals/Urinary Tract: Adrenal glands are normal. Left kidney is normal. There is a 1.5 cm exophytic low-attenuation lesion off the lateral cortex lower pole right kidney. This demonstrates average attenuation of 19. It does not appear to enhance. Stomach/Bowel: There is a small hiatal hernia. There is a nonobstructive bowel gas pattern. There is  severe diverticulosis of the sigmoid colon. The mid sigmoid colon shows severe diffuse wall thickening with mild surrounding inflammatory change. No free air free fluid or abscess. Vascular/Lymphatic: No acute findings Reproductive: Small bilateral fat containing inguinal hernias. Tiny volume of fluid in the right inguinal hernia. Partially visualized bilateral scrotal hydroceles. Other: None Musculoskeletal: No acute findings IMPRESSION: 1. Sigmoid diverticulitis 2. Moderate to large bilateral pleural effusions. Small pericardial effusion. 3. Incidental right renal lesion likely a cyst. Consider confirm this with renal ultrasound. Electronically Signed   By: Esperanza Heiraymond  Rubner M.D.   On: 11/21/2014 11:49       MDM   1. Diverticulitis of sigmoid colon   2. Abdominal pain, left lower quadrant   3. Pleural effusion, bilateral    Pt is a 59yo male c/o Left lower abdominal pain. Pt appears well, non-toxic, NAD.  He does have tenderness on exam. Decreased breath sounds in bilateral lower lung fields. Wife does recall pt having an intermittent non-productive cough for about a week or so. Denies CP or SOB UA: normal, doubt renal stones or UTI.   CT abd performed to r/o hernia or diverticulitis. CT: sigmoid diverticulitis, also moderate to large bilateral pleural  effusions.   Pt denies SOB.  Discussed pt with his PCP, Dr. Ivan Anchors, suggested treating the diverticulitis with Augmentin as it will also cover any respiratory bacteria that may develop from pt's pleural effusion.  Vitals are WNL, O2 Sat- 98% on RA Pt is safe for discharge home. He has f/u scheduled with cardiology on Wednesday 10/19. Dr. Ivan Anchors, sent in a referral for pulmonology.  Pt advised to call pulmonology if he does not hear from the clinic in 2 days to schedule f/u appointment.  Rx: augmentin and norco  Discussed symptoms that warrant emergent care in the ED. Patient verbalized understanding and agreement with treatment  plan.  Junius Finner, PA-C 11/21/14 1233

## 2014-11-21 NOTE — Telephone Encounter (Signed)
Referral to pulm, asymptomatic bilateral pleural effusion. Advised to go to ED if any sob.

## 2014-11-21 NOTE — ED Notes (Signed)
Pt c/o lower midline abdominal pain x last night. Denies N/V/D, mild flank pain. No urinary symptoms.

## 2014-11-22 ENCOUNTER — Telehealth: Payer: Self-pay | Admitting: *Deleted

## 2014-11-22 ENCOUNTER — Telehealth: Payer: Self-pay | Admitting: Pulmonary Disease

## 2014-11-22 NOTE — Telephone Encounter (Signed)
Spoke with pt and given appt with Dr Vassie LollAlva 11/23/14 at 2:00.

## 2014-11-23 ENCOUNTER — Institutional Professional Consult (permissible substitution): Payer: Self-pay | Admitting: Pulmonary Disease

## 2014-11-23 ENCOUNTER — Ambulatory Visit (INDEPENDENT_AMBULATORY_CARE_PROVIDER_SITE_OTHER): Payer: BLUE CROSS/BLUE SHIELD | Admitting: Family Medicine

## 2014-11-23 VITALS — Temp 98.7°F

## 2014-11-23 DIAGNOSIS — Z23 Encounter for immunization: Secondary | ICD-10-CM | POA: Diagnosis not present

## 2015-01-20 ENCOUNTER — Ambulatory Visit (INDEPENDENT_AMBULATORY_CARE_PROVIDER_SITE_OTHER): Payer: BLUE CROSS/BLUE SHIELD

## 2015-01-20 ENCOUNTER — Ambulatory Visit (INDEPENDENT_AMBULATORY_CARE_PROVIDER_SITE_OTHER): Payer: BLUE CROSS/BLUE SHIELD | Admitting: Family Medicine

## 2015-01-20 ENCOUNTER — Encounter: Payer: Self-pay | Admitting: Family Medicine

## 2015-01-20 VITALS — BP 117/79 | HR 90 | Wt 169.0 lb

## 2015-01-20 DIAGNOSIS — J90 Pleural effusion, not elsewhere classified: Secondary | ICD-10-CM | POA: Diagnosis not present

## 2015-01-20 DIAGNOSIS — E119 Type 2 diabetes mellitus without complications: Secondary | ICD-10-CM | POA: Diagnosis not present

## 2015-01-20 DIAGNOSIS — B9689 Other specified bacterial agents as the cause of diseases classified elsewhere: Secondary | ICD-10-CM

## 2015-01-20 DIAGNOSIS — A499 Bacterial infection, unspecified: Secondary | ICD-10-CM

## 2015-01-20 DIAGNOSIS — J329 Chronic sinusitis, unspecified: Secondary | ICD-10-CM

## 2015-01-20 DIAGNOSIS — I10 Essential (primary) hypertension: Secondary | ICD-10-CM | POA: Diagnosis not present

## 2015-01-20 LAB — POCT GLYCOSYLATED HEMOGLOBIN (HGB A1C): Hemoglobin A1C: 5.8

## 2015-01-20 MED ORDER — AMOXICILLIN-POT CLAVULANATE 875-125 MG PO TABS: 1.0000 | ORAL_TABLET | Freq: Two times a day (BID) | ORAL | Status: DC

## 2015-01-20 MED ORDER — METFORMIN HCL 1000 MG PO TABS: 1000.0000 mg | ORAL_TABLET | Freq: Two times a day (BID) | ORAL | Status: DC

## 2015-01-20 NOTE — Progress Notes (Signed)
CC: Peter Griffin is a 59 y.o. male is here for Hyperglycemia   Subjective: HPI:  Follow up type 2 diabetes: Continues take metformin with 100% compliance. He's been cutting back on carbohydrates in his diet ever since his heart attack. He denies polyuria or polyphagia polydipsia or poorly healing wounds. Denies vision loss. No outside blood sugars to report  Follow-up essential hypertension: Continues to take metoprolol on a daily basis along with lisinopril. No outside blood pressures report. Denies chest pain shortness of breath orthopnea nor peripheral edema  Back in October he was found to have a case of diverticulitis along with bilateral pleural effusions without any shortness of breath. He currently denies any shortness of breath or chest discomfort. Due to lack of symptoms he did not follow-up with pulmonology.  He complains of facial pressure in the forehead and cheeks along with nasal congestion postnasal drip that's been present for over a week now. It seems to be worsening over the past 2 days. He denies fevers chills or any motor or sensory disturbances. Symptoms are not improving with Mucinex.   Review Of Systems Outlined In HPI  Past Medical History  Diagnosis Date  . Hypertension   . Diabetes mellitus without complication [facility])     Past Surgical History  Procedure Laterality Date  . Dental surgery    . Tonsillectomy    . Quadriceps tendon repair Left 03/16/2012    Procedure: REPAIR QUADRICEP TENDON;  Surgeon: Eldred Manges, MD;  Location: [facility] OR;  Service: Orthopedics;  Laterality: Left;  . Cardiac surgery     No family history on file.  Social History   Social History  . Marital Status: Married    Spouse Name: N/A  . Number of Children: N/A  . Years of Education: N/A   Occupational History  . Not on file.   Social History Main Topics  . Smoking status: Never Smoker   . Smokeless tobacco: Never Used  . Alcohol Use: No  . Drug Use: No  . Sexual Activity:     Partners: Female   Other Topics Concern  . Not on file   Social History Narrative     Objective: BP 117/79 mmHg  Pulse 90  Wt 169 lb (76.658 kg)  General: Alert and Oriented, No Acute Distress HEENT: Pupils equal, round, reactive to light. Conjunctivae clear.  External ears unremarkable, canals clear with intact TMs with appropriate landmarks.  Middle ear appears open without effusion. Pink inferior turbinates.  Moist mucous membranes, pharynx without inflammation nor lesions.  Neck supple without palpable lymphadenopathy nor abnormal masses. Lungs: Clear to auscultation bilaterally, no wheezing/ronchi/rales.  Comfortable work of breathing. Good air movement. Cardiac: Regular rate and rhythm. Normal S1/S2.  No murmurs, rubs, nor gallops.   Extremities: No peripheral edema.  Strong peripheral pulses.  Mental Status: No depression, anxiety, nor agitation. Skin: Warm and dry.  Assessment & Plan: Bren was seen today for hyperglycemia.  Diagnoses and all orders for this visit:  Pleural effusion -     DG Chest 2 View; Future  Essential hypertension  Type 2 diabetes mellitus without complication, without long-term current use of insulin (HCC)  Bacterial sinusitis -     Discontinue: amoxicillin-clavulanate (AUGMENTIN) 875-125 MG tablet; Take 1 tablet by mouth 2 (two) times daily. One po bid x 7 days -     amoxicillin-clavulanate (AUGMENTIN) 875-125 MG tablet; Take 1 tablet by mouth 2 (two) times daily. One po bid x 7 days  Other orders -  metFORMIN (GLUCOPHAGE) 1000 MG tablet; Take 1 tablet (1,000 mg total) by mouth 2 (two) times daily.   Pleural effusion, still asymptomatic, obtaining a chest x-ray for follow-up evaluation. Essential hypertension: Controlled with metoprolol and lisinopril Type 2 diabetes: A1c of 5.8 controlled continue metformin Bacterial sinusitis: Start Augmentin continue Mucinex and consider nasal saline washes.  Return in about 3 months (around  04/20/2015).

## 2015-01-30 ENCOUNTER — Emergency Department (INDEPENDENT_AMBULATORY_CARE_PROVIDER_SITE_OTHER)
Admission: EM | Admit: 2015-01-30 | Discharge: 2015-01-30 | Disposition: A | Discharge status: Home / Self Care | Payer: BLUE CROSS/BLUE SHIELD | Source: Home / Self Care | Attending: Emergency Medicine | Admitting: Emergency Medicine

## 2015-01-30 ENCOUNTER — Encounter: Payer: Self-pay | Admitting: Emergency Medicine

## 2015-01-30 DIAGNOSIS — J209 Acute bronchitis, unspecified: Secondary | ICD-10-CM

## 2015-01-30 MED ORDER — PREDNISONE 10 MG (21) PO TBPK: ORAL_TABLET | ORAL | Status: DC

## 2015-01-30 MED ORDER — BENZONATATE 200 MG PO CAPS: ORAL_CAPSULE | ORAL | Status: DC

## 2015-01-30 MED ORDER — AZITHROMYCIN 250 MG PO TABS: ORAL_TABLET | ORAL | Status: DC

## 2015-01-30 NOTE — ED Provider Notes (Signed)
CSN: [id number]     Arrival date & time 01/30/15  0900 History   First MD Initiated Contact with Patient 01/30/15 [id number]     Chief Complaint  Patient presents with  . Cough   (Consider location/radiation/quality/duration/timing/severity/associated sxs/prior Treatment) HPI URI HISTORY  Peter Griffin is a 59 y.o. male who complains of onset of productive cough and chest congestion for 7 days.  Has been using 2 days of left-over Amox with no significant improvement. No chills/sweats + low-grade Fever  +  Nasal congestion +  Mild Discolored Post-nasal drainage No sinus pain/pressure No sore throat  +  Cough, yellow sputum + occ wheezing + chest congestion No hemoptysis No shortness of breath No pleuritic pain  No itchy/red eyes No earache  No nausea No vomiting No abdominal pain No diarrhea  No skin rashes +  Fatigue No myalgias No headache   Past Medical History  Diagnosis Date  . Hypertension   . Diabetes mellitus without complication [facility])    Past Surgical History  Procedure Laterality Date  . Dental surgery    . Tonsillectomy    . Quadriceps tendon repair Left 03/16/2012    Procedure: REPAIR QUADRICEP TENDON;  Surgeon: Eldred Manges, MD;  Location: [facility] OR;  Service: Orthopedics;  Laterality: Left;  . Cardiac surgery     No family history on file. Social History  Substance Use Topics  . Smoking status: Never Smoker   . Smokeless tobacco: Never Used  . Alcohol Use: No    Review of Systems  All other systems reviewed and are negative.   Allergies  Review of patient's allergies indicates no known allergies.  Home Medications   Prior to Admission medications   Medication Sig Start Date End Date Taking? Authorizing Provider  amoxicillin-clavulanate (AUGMENTIN) 875-125 MG tablet Take 1 tablet by mouth 2 (two) times daily. One po bid x 7 days 01/20/15   Laren Boom, DO  aspirin 81 MG tablet Take 81 mg by mouth daily.    Historical Provider, MD  atorvastatin  (LIPITOR) 80 MG tablet Take 1 tablet (80 mg total) by mouth daily. 10/21/14   Sean Hommel, DO  KRILL OIL PO Take 350 mg by mouth.    Historical Provider, MD  lisinopril (ZESTRIL) 2.5 MG tablet Take 1 tablet (2.5 mg total) by mouth daily. 10/21/14   Sean Hommel, DO  Lutein 6 MG CAPS Take by mouth.    Historical Provider, MD  metFORMIN (GLUCOPHAGE) 1000 MG tablet Take 1 tablet (1,000 mg total) by mouth 2 (two) times daily. 01/20/15   Laren Boom, DO  metoprolol succinate (TOPROL-XL) 50 MG 24 hr tablet Take 2 tablets (100 mg total) by mouth daily. Take with or immediately following a meal. 10/21/14   Sean Hommel, DO  multivitamin-lutein (OCUVITE-LUTEIN) CAPS Take 1 capsule by mouth daily.    Historical Provider, MD  ticagrelor (BRILINTA) 90 MG TABS tablet Take 1 tablet (90 mg total) by mouth 2 (two) times daily. 10/21/14   Laren Boom, DO   Meds Ordered and Administered this Visit  Medications - No data to display  BP 125/85 mmHg  Pulse 91  Temp(Src) 98.3 F (36.8 C) (Oral)  Ht  (1.803 m)  Wt 172 lb 12 oz (78.359 kg)  BMI 24.10 kg/m2  SpO2 98% No data found.   Physical Exam  Constitutional: He is oriented to person, place, and time. He appears well-developed and well-nourished. No distress.  HENT:  Head: Normocephalic and atraumatic.  Right Ear: Tympanic membrane  normal.  Left Ear: Tympanic membrane normal.  Nose: Nose normal.  Mouth/Throat: Oropharynx is clear and moist. No oropharyngeal exudate.  Eyes: Right eye exhibits no discharge. Left eye exhibits no discharge. No scleral icterus.  Neck: Neck supple.  Cardiovascular: Normal rate, regular rhythm and normal heart sounds.   Pulmonary/Chest: No respiratory distress. He has no decreased breath sounds. He has wheezes (mild, late expiratory, but good air movement bilaterally.). He has rhonchi (diffusely). He has no rales.  Lymphadenopathy:    He has no cervical adenopathy.  Neurological: He is alert and oriented to person, place,  and time.  Skin: Skin is warm and dry.  Nursing note and vitals reviewed.  Rhonchi. Minimal late expiratory wheeze but good air movement bilaterally ED Course  Procedures (including critical care time)  Labs Review Labs Reviewed - No data to display  Imaging Review No results found.    MDM   1. Acute bronchitis with bronchospasm    Treatment options discussed, as well as risks, benefits, alternatives. Patient voiced understanding and agreement with the following plans: azithromycin (ZITHROMAX Z-PAK) predniSONE (STERAPRED UNI-PAK 21 TAB) 10 MG (21) TBPK tablet Take as directed for 6 days.--Take 6 on day 1, 5 on day 2, 4 on day 3, then 3 tablets on day 4, then 2 tablets on day 5, then 1 on day 6.   benzonatate (TESSALON) 200 MG capsule Take 1 every 8 hours as needed for cough.   Other sxs care discussed. Follow-up with your primary care doctor in 5-7 days if not improving, or sooner if symptoms become worse. Precautions discussed. Red flags discussed. Questions invited and answered. Patient voiced understanding and agreement.    Lajean Manesavid Massey, MD 02/05/15 [id number]

## 2015-01-30 NOTE — ED Notes (Signed)
Pt c/o productive cough, chest congestion, has taken Amoxil, some improvement, runny nose.

## 2015-02-08 ENCOUNTER — Encounter: Payer: Self-pay | Admitting: Family Medicine

## 2015-02-08 DIAGNOSIS — I340 Nonrheumatic mitral (valve) insufficiency: Secondary | ICD-10-CM | POA: Insufficient documentation

## 2015-02-20 ENCOUNTER — Other Ambulatory Visit: Payer: Self-pay | Admitting: Family Medicine

## 2015-02-20 MED ORDER — LISINOPRIL 10 MG PO TABS: 10.0000 mg | ORAL_TABLET | Freq: Every day | ORAL | Status: DC

## 2015-03-21 ENCOUNTER — Ambulatory Visit (INDEPENDENT_AMBULATORY_CARE_PROVIDER_SITE_OTHER): Payer: BLUE CROSS/BLUE SHIELD

## 2015-03-21 ENCOUNTER — Encounter: Payer: Self-pay | Admitting: Family Medicine

## 2015-03-21 ENCOUNTER — Ambulatory Visit (INDEPENDENT_AMBULATORY_CARE_PROVIDER_SITE_OTHER): Payer: BLUE CROSS/BLUE SHIELD | Admitting: Family Medicine

## 2015-03-21 VITALS — BP 139/106 | HR 100 | Temp 98.4°F | Wt 180.0 lb

## 2015-03-21 DIAGNOSIS — J90 Pleural effusion, not elsewhere classified: Secondary | ICD-10-CM | POA: Diagnosis not present

## 2015-03-21 DIAGNOSIS — R1011 Right upper quadrant pain: Secondary | ICD-10-CM | POA: Diagnosis not present

## 2015-03-21 DIAGNOSIS — R109 Unspecified abdominal pain: Secondary | ICD-10-CM | POA: Insufficient documentation

## 2015-03-21 DIAGNOSIS — J9811 Atelectasis: Secondary | ICD-10-CM | POA: Diagnosis not present

## 2015-03-21 LAB — COMPREHENSIVE METABOLIC PANEL
ALBUMIN: 3.9 g/dL (ref 3.6–5.1)
ALK PHOS: 96 U/L (ref 40–115)
ALT: 23 U/L (ref 9–46)
AST: 21 U/L (ref 10–35)
BUN: 20 mg/dL (ref 7–25)
CALCIUM: 9.5 mg/dL (ref 8.6–10.3)
CHLORIDE: 104 mmol/L (ref 98–110)
CO2: 25 mmol/L (ref 20–31)
Creat: 1.03 mg/dL (ref 0.70–1.25)
Glucose, Bld: 104 mg/dL — ABNORMAL HIGH (ref 65–99)
POTASSIUM: 4.7 mmol/L (ref 3.5–5.3)
Sodium: 137 mmol/L (ref 135–146)
TOTAL PROTEIN: 5.6 g/dL — AB (ref 6.1–8.1)
Total Bilirubin: 0.8 mg/dL (ref 0.2–1.2)

## 2015-03-21 LAB — CBC
HEMATOCRIT: 44.6 % (ref 39.0–52.0)
HEMOGLOBIN: 14.7 g/dL (ref 13.0–17.0)
MCH: 27.4 pg (ref 26.0–34.0)
MCHC: 33 g/dL (ref 30.0–36.0)
MCV: 83.1 fL (ref 78.0–100.0)
MPV: 10.7 fL (ref 8.6–12.4)
Platelets: 251 10*3/uL (ref 150–400)
RBC: 5.37 MIL/uL (ref 4.22–5.81)
RDW: 14.8 % (ref 11.5–15.5)
WBC: 6.7 10*3/uL (ref 4.0–10.5)

## 2015-03-21 LAB — LIPASE: LIPASE: 16 U/L (ref 7–60)

## 2015-03-21 MED ORDER — METRONIDAZOLE 500 MG PO TABS: 500.0000 mg | ORAL_TABLET | Freq: Three times a day (TID) | ORAL | Status: DC

## 2015-03-21 MED ORDER — CIPROFLOXACIN HCL 500 MG PO TABS: 500.0000 mg | ORAL_TABLET | Freq: Two times a day (BID) | ORAL | Status: DC

## 2015-03-21 NOTE — Assessment & Plan Note (Signed)
This is most likely due to acute diverticulitis given personal history, though could also be due to gastroenteritis, bilary or pancreatic etiology, though the latter two are inconsistent with the history. We will treat presumptively for diverticulitis, though this could also cover many bacterial causes of gastroenteritis. EKG was normal, will get CXR and AXR given exam findings of potential R heart failure. Will order lipase and basic labs to evaluate for alternative pathology. Next step would be U/S for gallbladder stones.

## 2015-03-21 NOTE — Progress Notes (Signed)
Peter Griffin is a 60 y.o. male who presents to [facility] today for abdominal pain.  Patient was in his usual state of health until Sunday evening 2 days ago when he was walking a short distance home from his neighbors and had sudden onset sharp lower abdominal pain 7/10 severity followed by fecal incontinence. He went home minutes later and had another episode of diarrhea. The pain subsided. He was well until yesterday when he had progressive onset of constant RUQ and lower abdominal pain that feels sore and progressed to 7/10 constant pain throughout the night that progressively got better until this morning which is 1-2/10 in severity. This pain is not related to eating meals and this pain was not preceded by exertion.  Patient has a history of MI and this is not consistent with previous episode. Patient also has a history of diverticulitis and this feels similar to that episode 8 months ago that resolved with antibiotics. Patient has never had a colonoscopy. Patient denies recent blood in stool, melena, changing frequency, color or consistency of stools, diaphoresis, nausea, vomiting, weight loss, fatigue, shoulder, arm, chest or jaw pain. Patient denies dyspnea since he began taking a diuretic.     Past Medical History  Diagnosis Date  . Hypertension   . Diabetes mellitus without complication [facility])    Past Surgical History  Procedure Laterality Date  . Dental surgery    . Tonsillectomy    . Quadriceps tendon repair Left 03/16/2012    Procedure: REPAIR QUADRICEP TENDON;  Surgeon: Peter Manges, MD;  Location: [facility] OR;  Service: Orthopedics;  Laterality: Left;  . Cardiac surgery     Social History  Substance Use Topics  . Smoking status: Never Smoker   . Smokeless tobacco: Never Used  . Alcohol Use: No   family history is not on file.  ROS as above Medications: Current  Outpatient Prescriptions  Medication Sig Dispense Refill  . aspirin 81 MG tablet Take 81 mg by mouth daily.    Marland Kitchen atorvastatin (LIPITOR) 80 MG tablet Take 1 tablet (80 mg total) by mouth daily.    . benzonatate (TESSALON) 200 MG capsule Take 1 every 8 hours as needed for cough. 20 capsule 0  . KRILL OIL PO Take 350 mg by mouth.    Marland Kitchen lisinopril (PRINIVIL,ZESTRIL) 10 MG tablet Take 1 tablet (10 mg total) by mouth daily. 90 tablet 3  . Lutein 6 MG CAPS Take by mouth.    . metFORMIN (GLUCOPHAGE) 1000 MG tablet Take 1 tablet (1,000 mg total) by mouth 2 (two) times daily. 90 tablet 1  . metoprolol succinate (TOPROL-XL) 50 MG 24 hr tablet Take 2 tablets (100 mg total) by mouth daily. Take with or immediately following a meal. 90 tablet 3  . multivitamin-lutein (OCUVITE-LUTEIN) CAPS Take 1 capsule by mouth daily.    . ticagrelor (BRILINTA) 90 MG TABS tablet Take 1 tablet (90 mg total) by mouth 2 (two) times daily. 60 tablet   . ciprofloxacin (CIPRO) 500 MG tablet Take 1 tablet (500 mg total) by mouth 2 (two) times daily. 14 tablet 0  . metroNIDAZOLE (FLAGYL) 500 MG tablet Take 1 tablet (500 mg total) by mouth 3 (three) times daily. 21 tablet 0   No current facility-administered medications for this visit.   No Known Allergies   Exam:  BP 139/106 mmHg  Pulse 100  Temp(Src) 98.4 F (36.9 C) (Oral)  Wt 180 lb (81.647 kg)  SpO2 100% Gen: Uncomfortable appearing, though non-toxic appearing gentleman in NAD HEENT: EOMI,  MMM  Lungs: Normal work of breathing. CTABL no crackles Heart: JVD 4cm above the clavicle. RRR no MRG Abd: NABS, Soft. Nondistended, tender to deep palpation in RUQ. Negative murpys sign.  Exts: Brisk capillary refill, warm and well perfused. 2+ edema bilateral LE.   No results found for this or any previous visit (from the past 24 hour(s)). No results found.  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

## 2015-03-21 NOTE — Patient Instructions (Signed)
Thank you for coming in today. Take both medicines.  Do not drink alcohol with these medicines.  Get xray and labs today.  Return Thursday or Friday or sooner if needed.  If your belly pain worsens, or you have high fever, bad vomiting, blood in your stool or black tarry stool go to the Emergency Room.    Diverticulitis Diverticulitis is inflammation or infection of small pouches in your colon that form when you have a condition called diverticulosis. The pouches in your colon are called diverticula. Your colon, or large intestine, is where water is absorbed and stool is formed. Complications of diverticulitis can include:  Bleeding.  Severe infection.  Severe pain.  Perforation of your colon.  Obstruction of your colon. CAUSES  Diverticulitis is caused by bacteria. Diverticulitis happens when stool becomes trapped in diverticula. This allows bacteria to grow in the diverticula, which can lead to inflammation and infection. RISK FACTORS People with diverticulosis are at risk for diverticulitis. Eating a diet that does not include enough fiber from fruits and vegetables may make diverticulitis more likely to develop. SYMPTOMS  Symptoms of diverticulitis may include:  Abdominal pain and tenderness. The pain is normally located on the left side of the abdomen, but may occur in other areas.  Fever and chills.  Bloating.  Cramping.  Nausea.  Vomiting.  Constipation.  Diarrhea.  Blood in your stool. DIAGNOSIS  Your health care provider will ask you about your medical history and do a physical exam. You may need to have tests done because many medical conditions can cause the same symptoms as diverticulitis. Tests may include:  Blood tests.  Urine tests.  Imaging tests of the abdomen, including X-rays and CT scans. When your condition is under control, your health care provider may recommend that you have a colonoscopy. A colonoscopy can show how severe your diverticula  are and whether something else is causing your symptoms. TREATMENT  Most cases of diverticulitis are mild and can be treated at home. Treatment may include:  Taking over-the-counter pain medicines.  Following a clear liquid diet.  Taking antibiotic medicines by mouth for 7-10 days. More severe cases may be treated at a hospital. Treatment may include:  Not eating or drinking.  Taking prescription pain medicine.  Receiving antibiotic medicines through an IV tube.  Receiving fluids and nutrition through an IV tube.  Surgery. HOME CARE INSTRUCTIONS   Follow your health care provider's instructions carefully.  Follow a full liquid diet or other diet as directed by your health care provider. After your symptoms improve, your health care provider may tell you to change your diet. He or she may recommend you eat a high-fiber diet. Fruits and vegetables are good sources of fiber. Fiber makes it easier to pass stool.  Take fiber supplements or probiotics as directed by your health care provider.  Only take medicines as directed by your health care provider.  Keep all your follow-up appointments. SEEK MEDICAL CARE IF:   Your pain does not improve.  You have a hard time eating food.  Your bowel movements do not return to normal. SEEK IMMEDIATE MEDICAL CARE IF:   Your pain becomes worse.  Your symptoms do not get better.  Your symptoms suddenly get worse.  You have a fever.  You have repeated vomiting.  You have bloody or black, tarry stools. MAKE SURE YOU:   Understand these instructions.  Will watch your condition.  Will get help right away if you are not doing well  or get worse.   This information is not intended to replace advice given to you by your health care provider. Make sure you discuss any questions you have with your health care provider.   Document Released: 10/31/2004 Document Revised: 01/26/2013 Document Reviewed: 12/16/2012 Elsevier Interactive  Patient Education [employer]

## 2015-03-22 NOTE — Progress Notes (Signed)
Quick Note:  Xray is concerning for gall stones. If not better we will get an ultrasound. ______

## 2015-03-22 NOTE — Progress Notes (Signed)
Quick Note:    Labs are normal.  ______

## 2015-03-28 ENCOUNTER — Ambulatory Visit (INDEPENDENT_AMBULATORY_CARE_PROVIDER_SITE_OTHER): Payer: BLUE CROSS/BLUE SHIELD | Admitting: Family Medicine

## 2015-03-28 ENCOUNTER — Encounter: Payer: Self-pay | Admitting: Family Medicine

## 2015-03-28 VITALS — BP 147/109 | HR 103 | Wt 183.0 lb

## 2015-03-28 DIAGNOSIS — E119 Type 2 diabetes mellitus without complications: Secondary | ICD-10-CM | POA: Diagnosis not present

## 2015-03-28 DIAGNOSIS — I10 Essential (primary) hypertension: Secondary | ICD-10-CM

## 2015-03-28 MED ORDER — LOSARTAN POTASSIUM 100 MG PO TABS: 100.0000 mg | ORAL_TABLET | Freq: Every day | ORAL | Status: DC

## 2015-03-28 NOTE — Patient Instructions (Signed)
Please write down BP values for one week, daily:                    .

## 2015-03-28 NOTE — Progress Notes (Signed)
CC: Peter Griffin is a 60 y.o. male is here for Hypertension   Subjective: HPI:  FU DM2: Taking metformin with 100% compliance. No outside blood sugars to report. No polyuria, polydipsia, nor vision loss.   FU HTN: Taking lisinopril and metoprolol. He is concerned lisinopril could be causing his shortness of breath. He experimented a few weeks ago by stopping this medication and shortness of breath resolved however restarted as soon as he began taking lisinopril again. He denies any cough, wheezing, chest discomfort. Diastolic blood pressure at home has been above 100 every time he checks it at home. Denies any new motor or sensory disturbances  Abdominal pain fully resolved 2-3 days after taking Cipro and metronidazole  Review Of Systems Outlined In HPI  Past Medical History  Diagnosis Date  . Hypertension   . Diabetes mellitus without complication [facility])     Past Surgical History  Procedure Laterality Date  . Dental surgery    . Tonsillectomy    . Quadriceps tendon repair Left 03/16/2012    Procedure: REPAIR QUADRICEP TENDON;  Surgeon: Eldred Manges, MD;  Location: [facility] OR;  Service: Orthopedics;  Laterality: Left;  . Cardiac surgery     No family history on file.  Social History   Social History  . Marital Status: Married    Spouse Name: N/A  . Number of Children: N/A  . Years of Education: N/A   Occupational History  . Not on file.   Social History Main Topics  . Smoking status: Never Smoker   . Smokeless tobacco: Never Used  . Alcohol Use: No  . Drug Use: No  . Sexual Activity:    Partners: Female   Other Topics Concern  . Not on file   Social History Narrative     Objective: BP 147/109 mmHg  Pulse 103  Wt 183 lb (83.008 kg)  General: Alert and Oriented, No Acute Distress HEENT: Pupils equal, round, reactive to light. Conjunctivae clear.  Moist mucous membranes Lungs: Clear to auscultation bilaterally, no wheezing/ronchi/rales.  Comfortable work of  breathing. Good air movement. Cardiac: Regular rate and rhythm. Normal S1/S2.  No murmurs, rubs, nor gallops.   Abdomen: Soft nontender no rebound tenderness Extremities: No peripheral edema.  Strong peripheral pulses.  Mental Status: No depression, anxiety, nor agitation. Skin: Warm and dry.  Assessment & Plan: Peter Griffin was seen today for hypertension.  Diagnoses and all orders for this visit:  Type 2 diabetes mellitus without complication, without long-term current use of insulin (HCC) -     Hemoglobin A1c  Essential hypertension  Other orders -     losartan (COZAAR) 100 MG tablet; Take 1 tablet (100 mg total) by mouth daily.   Type 2 diabetes: Due for A1c in March, an order was given to him today if he would  Like to just do a lab only visit. Otherwise follow-up in March and we can do a fingerstick when rechecking his blood pressure here. Essential hypertension: Uncontrolled chronic condition switching from lisinopril to losartan, continue current dose of metoprolol. I've asked him to drop off a diary of daily blood pressures after 1 week of this medication.  Return in about 4 weeks (around 04/25/2015).

## 2015-03-30 ENCOUNTER — Ambulatory Visit: Payer: Self-pay | Admitting: Family Medicine

## 2015-04-12 ENCOUNTER — Ambulatory Visit (INDEPENDENT_AMBULATORY_CARE_PROVIDER_SITE_OTHER): Payer: BLUE CROSS/BLUE SHIELD | Admitting: Family Medicine

## 2015-04-12 ENCOUNTER — Encounter: Payer: Self-pay | Admitting: Family Medicine

## 2015-04-12 VITALS — BP 106/75 | HR 90 | Wt 162.0 lb

## 2015-04-12 DIAGNOSIS — I255 Ischemic cardiomyopathy: Secondary | ICD-10-CM

## 2015-04-12 DIAGNOSIS — J90 Pleural effusion, not elsewhere classified: Secondary | ICD-10-CM | POA: Diagnosis not present

## 2015-04-12 LAB — BASIC METABOLIC PANEL WITH GFR
BUN: 22 mg/dL (ref 7–25)
CO2: 28 mmol/L (ref 20–31)
Calcium: 9.1 mg/dL (ref 8.6–10.3)
Chloride: 102 mmol/L (ref 98–110)
Creat: 0.88 mg/dL (ref 0.70–1.25)
GFR, Est African American: >89 mL/min (ref >=60)
GFR, Est Non African American: >89 mL/min (ref >=60)
Glucose, Bld: 125 mg/dL — ABNORMAL HIGH (ref 65–99)
POTASSIUM: 3.4 mmol/L — AB (ref 3.5–5.3)
Sodium: 141 mmol/L (ref 135–146)

## 2015-04-12 NOTE — Progress Notes (Signed)
CC: Peter Griffin is a 60 y.o. male is here for Hospitalization Follow-up   Subjective: HPI:  Hospital follow-up: He was admitted from Sunday-Wednesday last week. He had bilateral pleural effusions with symptomatic hypoxemia. He was experiencing shortness of breath when lying down or going up inclines. This was out of character for him and after noticing his ankles are swelling he went to the local emergency room. He had almost 20 pounds of fluid diuresed off of him with IV Lasix. He's now been taking 40 mg of Lasix twice a day. He tells me prior to leaving the hospital he had another x-ray of his chest done was told that this was favorable and reassuring. He tells me he feels great right now and has no shortness of breath, wheezing, abdominal distention, ankle swelling or peripheral edema.    Review Of Systems Outlined In HPI  Past Medical History  Diagnosis Date  . Hypertension   . Diabetes mellitus without complication [facility](HCC)     Past Surgical History  Procedure Laterality Date  . Dental surgery    . Tonsillectomy    . Quadriceps tendon repair Left 03/16/2012    Procedure: REPAIR QUADRICEP TENDON;  Surgeon: Eldred MangesMark C Yates, MD;  [facility];  Service: Orthopedics;  Laterality: Left;  . Cardiac surgery     No family history on file.  Social History   Social History  . Marital Status: Married    Spouse Name: N/A  . Number of Children: N/A  . Years of Education: N/A   Occupational History  . Not on file.   Social History Main Topics  . Smoking status: Never Smoker   . Smokeless tobacco: Never Used  . Alcohol Use: No  . Drug Use: No  . Sexual Activity:    Partners: Female   Other Topics Concern  . Not on file   Social History Narrative     Objective: BP 106/75 mmHg  Pulse 90  Wt 162 lb (73.483 kg)  General: Alert and Oriented, No Acute Distress HEENT: Pupils equal, round, reactive to light. Conjunctivae clear. Moist mucous membranes  Lungs: Clear to  auscultation bilaterally, no wheezing/ronchi/rales.  Comfortable work of breathing. Good air movement. Cardiac: Regular rate and rhythm. Normal S1/S2.  No murmurs, rubs, nor gallops.   Abdomen:Soft nontender  Extremities: No peripheral edema.  Strong peripheral pulses.  Mental Status: No depression, anxiety, nor agitation. Skin: Warm and dry.  Assessment & Plan: [name] was seen today for hospitalization follow-up.  Diagnoses and all orders for this visit:  Ischemic cardiomyopathy -     BASIC METABOLIC PANEL WITH GFR  Pleural effusion -     BASIC METABOLIC PANEL WITH GFR   Pleural effusion now resolved, reassuring lung exam today. Checking electrolytes given recent increase in Lasix. Continue 40 mg of Lasix twice a day pending the above results.  25 minutes spent face-to-face during visit today of which at least 50% was counseling or coordinating care regarding: 1. Ischemic cardiomyopathy   2. Pleural effusion      Return in about 3 months (around 07/13/2015).

## 2015-04-21 ENCOUNTER — Ambulatory Visit: Payer: Self-pay | Admitting: Family Medicine

## 2015-04-28 LAB — HM DIABETES EYE EXAM

## 2015-05-17 ENCOUNTER — Encounter: Payer: Self-pay | Admitting: Family Medicine

## 2015-05-17 ENCOUNTER — Ambulatory Visit (INDEPENDENT_AMBULATORY_CARE_PROVIDER_SITE_OTHER): Payer: BLUE CROSS/BLUE SHIELD

## 2015-05-17 ENCOUNTER — Ambulatory Visit (INDEPENDENT_AMBULATORY_CARE_PROVIDER_SITE_OTHER): Payer: BLUE CROSS/BLUE SHIELD | Admitting: Family Medicine

## 2015-05-17 VITALS — BP 123/91 | HR 101 | Wt 172.0 lb

## 2015-05-17 DIAGNOSIS — J90 Pleural effusion, not elsewhere classified: Secondary | ICD-10-CM

## 2015-05-17 DIAGNOSIS — R0602 Shortness of breath: Secondary | ICD-10-CM

## 2015-05-17 LAB — CBC
HCT: 43.7 % (ref 38.5–50.0)
Hemoglobin: 14.5 g/dL (ref 13.2–17.1)
MCH: 27.1 pg (ref 27.0–33.0)
MCHC: 33.2 g/dL (ref 32.0–36.0)
MCV: 81.7 fL (ref 80.0–100.0)
MPV: 10.5 fL (ref 7.5–12.5)
PLATELETS: 223 10*3/uL (ref 140–400)
RBC: 5.35 MIL/uL (ref 4.20–5.80)
RDW: 16.2 % — AB (ref 11.0–15.0)
WBC: 7.5 10*3/uL (ref 3.8–10.8)

## 2015-05-17 LAB — COMPLETE METABOLIC PANEL WITH GFR
ALBUMIN: 4.1 g/dL (ref 3.6–5.1)
ALK PHOS: 112 U/L (ref 40–115)
ALT: 60 U/L — AB (ref 9–46)
AST: 45 U/L — ABNORMAL HIGH (ref 10–35)
BILIRUBIN TOTAL: 0.9 mg/dL (ref 0.2–1.2)
BUN: 25 mg/dL (ref 7–25)
CO2: 27 mmol/L (ref 20–31)
CREATININE: 0.94 mg/dL (ref 0.70–1.25)
Calcium: 9.2 mg/dL (ref 8.6–10.3)
Chloride: 100 mmol/L (ref 98–110)
GFR, Est African American: >89 mL/min (ref >=60)
GFR, Est Non African American: 88 mL/min (ref >=60)
GLUCOSE: 147 mg/dL — AB (ref 65–99)
Potassium: 4 mmol/L (ref 3.5–5.3)
SODIUM: 138 mmol/L (ref 135–146)
TOTAL PROTEIN: 5.7 g/dL — AB (ref 6.1–8.1)

## 2015-05-17 NOTE — Progress Notes (Signed)
CC: Peter Griffin is a 60 y.o. male is here for Dizziness and Shortness of Breath   Subjective: HPI:  For the past 2-3 days he is experiencing unintentional weight gain and shortness of breath with a sensation of bloating. Symptoms have been moderate in severity and starting yesterday he began taking 80 mg of Lasix twice a day he's taken a total of 2 doses and has noticed that his urination frequency has increased. Overall symptoms have not changed at all since changing Lasix regimen. He tells me he feels somewhat nauseous and has lack of appetite. Nothing seems to make symptoms better or worse other than walking makes shortness of breath worse. Denies fevers, chills, cough, chest discomfort but has noticed a more rapid than normal heart rate. Denies headache, rash, nor joint pain. Review of systems positive for mild edema in the ankles patient or diarrhea.   Review Of Systems Outlined In HPI  Past Medical History  Diagnosis Date  . Hypertension   . Diabetes mellitus without complication [facility](HCC)     Past Surgical History  Procedure Laterality Date  . Dental surgery    . Tonsillectomy    . Quadriceps tendon repair Left 03/16/2012    Procedure: REPAIR QUADRICEP TENDON;  Surgeon: Eldred MangesMark C Yates, MD;  [facility];  Service: Orthopedics;  Laterality: Left;  . Cardiac surgery     No family history on file.  Social History   Social History  . Marital Status: Married    Spouse Name: N/A  . Number of Children: N/A  . Years of Education: N/A   Occupational History  . Not on file.   Social History Main Topics  . Smoking status: Never Smoker   . Smokeless tobacco: Never Used  . Alcohol Use: No  . Drug Use: No  . Sexual Activity:    Partners: Female   Other Topics Concern  . Not on file   Social History Narrative     Objective: BP 123/91 mmHg  Pulse 101  Wt 172 lb (78.019 kg)  SpO2 96%  General: Alert and Oriented, No Acute Distress HEENT: Pupils equal, round, reactive  to light. Conjunctivae clear. Moist mucous membranes  Lungs: Clear to auscultation bilaterally, no wheezing/ronchi/rales.  Comfortable work of breathing. Good air movement. Cardiac:Mild tachycardia regular rhythm  Normal S1/S2.  No murmurs, rubs, nor gallops.   Abdomen:Soft nontender  Extremities: No peripheral edema.  Strong peripheral pulses.  Mental Status: No depression, anxiety, nor agitation. Skin: Warm and dry.  EKG showing normal sinus rhythm, normal axis, no ST elevation or depression.  Assessment & Plan: [name] was seen today for dizziness and shortness of breath.  Diagnoses and all orders for this visit:  SOB (shortness of breath) -     COMPLETE METABOLIC PANEL WITH GFR -     CBC -     Cancel: DG Chest 2 View; Future -     DG Chest 2 View; Future   169.6 pounds at home this morning. He has already urinated 4 times this morning. After he got a chest x-ray that shows pleural effusion on the right greater than left. Remembered that the day before he started gaining weight he had 2 sandwiches from [employer], this is most likely the culprit that caused him to go over the edge of starting to retain fluid. Continue Lasix 80 mg twice a day will check in with him tomorrow once labs come back.  Return if symptoms worsen or fail to improve.

## 2015-05-17 NOTE — Addendum Note (Signed)
Addended by: Collie SiadICHARDSON, Chai Routh M on: 05/17/2015 11:07 AM   Modules accepted: Orders

## 2015-05-19 ENCOUNTER — Emergency Department
Admission: EM | Admit: 2015-05-19 | Discharge: 2015-05-19 | Disposition: A | Discharge status: Home / Self Care | Payer: BLUE CROSS/BLUE SHIELD | Source: Home / Self Care | Attending: Family Medicine | Admitting: Family Medicine

## 2015-05-19 ENCOUNTER — Encounter: Payer: Self-pay | Admitting: *Deleted

## 2015-05-19 DIAGNOSIS — R609 Edema, unspecified: Secondary | ICD-10-CM

## 2015-05-19 DIAGNOSIS — I5023 Acute on chronic systolic (congestive) heart failure: Secondary | ICD-10-CM | POA: Diagnosis not present

## 2015-05-19 NOTE — ED Provider Notes (Signed)
CSN: [id number]     Arrival date & time 05/19/15  1915 History   First MD Initiated Contact with Patient 05/19/15 1953     Chief Complaint  Patient presents with  . Leg Swelling  . Follow-up      HPI Comments: Patient was placed on a 3 day regimen of increased Lasix; he notes that he has not noticed much change in his condition, although he is no worse.  He was hospitalized last month for exacerbation of CHG secondary to ischemic cardiomyopathy. He notes dyspnea on exertion, and he awakens at night with dyspnea.  No chest pain.  He notes that his legs swell each afternoon, and the swelling has resolved by morning.  The history is provided by the patient and the spouse.    Past Medical History  Diagnosis Date  . Hypertension   . Diabetes mellitus without complication [facility](HCC)    Past Surgical History  Procedure Laterality Date  . Dental surgery    . Tonsillectomy    . Quadriceps tendon repair Left 03/16/2012    Procedure: REPAIR QUADRICEP TENDON;  Surgeon: Eldred MangesMark C Yates, MD;  [facility];  Service: Orthopedics;  Laterality: Left;  . Cardiac surgery     History reviewed. No pertinent family history. Social History  Substance Use Topics  . Smoking status: Never Smoker   . Smokeless tobacco: Never Used  . Alcohol Use: No    Review of Systems  Constitutional: Positive for fatigue. Negative for fever, chills, diaphoresis, activity change, appetite change and unexpected weight change.  HENT: Negative.   Eyes: Negative.   Respiratory: Positive for shortness of breath. Negative for cough, chest tightness and wheezing.   Cardiovascular: Positive for leg swelling. Negative for chest pain and palpitations.  Gastrointestinal: Negative.   Genitourinary: Positive for decreased urine volume.  Musculoskeletal: Negative.   Skin: Negative.   Neurological: Negative for headaches.    Allergies  Lisinopril  Home Medications   Prior to Admission medications   Medication Sig Start Date  End Date Taking? Authorizing Provider  aspirin 81 MG tablet Take 81 mg by mouth daily.    Historical Provider, MD  atorvastatin (LIPITOR) 80 MG tablet Take 1 tablet (80 mg total) by mouth daily. 10/21/14   Sean Hommel, DO  benzonatate (TESSALON) 200 MG capsule Take 1 every 8 hours as needed for cough. 01/30/15   Lajean Manesavid Massey, MD  furosemide (LASIX) 40 MG tablet Take 40 mg by mouth 2 (two) times daily. 04/05/15   Historical Provider, MD  KRILL OIL PO Take 350 mg by mouth.    Historical Provider, MD  losartan (COZAAR) 100 MG tablet Take 1 tablet (100 mg total) by mouth daily. 03/28/15   Sean Hommel, DO  Lutein 6 MG CAPS Take by mouth.    Historical Provider, MD  metFORMIN (GLUCOPHAGE) 1000 MG tablet Take 1 tablet (1,000 mg total) by mouth 2 (two) times daily. 01/20/15   Laren BoomSean Hommel, DO  metoprolol succinate (TOPROL-XL) 50 MG 24 hr tablet Take 2 tablets (100 mg total) by mouth daily. Take with or immediately following a meal. 10/21/14   Sean Hommel, DO  multivitamin-lutein (OCUVITE-LUTEIN) CAPS Take 1 capsule by mouth daily.    Historical Provider, MD  ticagrelor (BRILINTA) 90 MG TABS tablet Take 1 tablet (90 mg total) by mouth 2 (two) times daily. 10/21/14   Laren BoomSean Hommel, DO   Meds Ordered and Administered this Visit  Medications - No data to display  BP 109/88 mmHg  Pulse 103  Temp(Src) 97.8 F (36.6 C) (Oral)  Resp 16  Wt 173 lb (78.472 kg)  SpO2 100% No data found.   Physical Exam Nursing notes and Vital Signs reviewed. Appearance:  Patient appears stated age, and in no acute distress Eyes:  Pupils are equal, round, and reactive to light and accomodation.  Extraocular movement is intact.  Conjunctivae are not inflamed  Nose:  Normal Pharynx:  Normal; moist mucous membranes  Neck:  Supple.  No adenopathy or JVD Lungs:  Clear to auscultation.  Breath sounds are equal.  Moving air well. Heart:  Regular rate and rhythm without murmurs, rubs, or gallops.  Abdomen:  Nontender without masses or  hepatosplenomegaly.  Bowel sounds are present.  No CVA or flank tenderness.  Extremities:  Pitting edema below knees  Skin:  No rash present.   ED Course  Procedures none     MDM   1. Acute on chronic systolic congestive heart failure (HCC)   2. Dependent edema    Continue present medications. Note history of PND on review of systems.  Recommend obtaining below the knee compression stockings:  Moderate support (15 to 20 mmHg compression).  Wear during the day and take off at bedtime. Followup with Family Doctor in one week.    Lattie Haw, MD 05/22/15 [phone]

## 2015-05-19 NOTE — ED Notes (Signed)
Pt was seen by Dr. Ivan AnchorsHommel for leg swelling, weight gain SOB with long exertion. CXR showed fluid on his lungs. He reports he is not seeing any change in 3 days, although he is no better and no worse.

## 2015-05-19 NOTE — Discharge Instructions (Signed)
Continue present medications. Recommend obtaining below the knee compression stockings:  Moderate support (15 to 20 mmHg compression).  Wear during the day and take off at bedtime.

## 2015-05-20 ENCOUNTER — Telehealth: Payer: Self-pay | Admitting: Emergency Medicine

## 2015-05-22 ENCOUNTER — Encounter: Payer: Self-pay | Admitting: Family Medicine

## 2015-05-22 DIAGNOSIS — K819 Cholecystitis, unspecified: Secondary | ICD-10-CM | POA: Insufficient documentation

## 2015-05-29 ENCOUNTER — Ambulatory Visit (INDEPENDENT_AMBULATORY_CARE_PROVIDER_SITE_OTHER): Payer: BLUE CROSS/BLUE SHIELD | Admitting: Family Medicine

## 2015-05-29 ENCOUNTER — Encounter: Payer: Self-pay | Admitting: Family Medicine

## 2015-05-29 VITALS — BP 104/81 | HR 96 | Wt 178.0 lb

## 2015-05-29 DIAGNOSIS — I5023 Acute on chronic systolic (congestive) heart failure: Secondary | ICD-10-CM | POA: Diagnosis not present

## 2015-05-29 DIAGNOSIS — J90 Pleural effusion, not elsewhere classified: Secondary | ICD-10-CM | POA: Diagnosis not present

## 2015-05-29 DIAGNOSIS — K219 Gastro-esophageal reflux disease without esophagitis: Secondary | ICD-10-CM | POA: Diagnosis not present

## 2015-05-29 DIAGNOSIS — I255 Ischemic cardiomyopathy: Secondary | ICD-10-CM

## 2015-05-29 HISTORY — DX: Acute on chronic systolic (congestive) heart failure: I50.23

## 2015-05-29 LAB — COMPLETE METABOLIC PANEL WITH GFR
ALT: 273 U/L — AB (ref 9–46)
AST: 40 U/L — AB (ref 10–35)
Albumin: 3.8 g/dL (ref 3.6–5.1)
Alkaline Phosphatase: 97 U/L (ref 40–115)
BUN: 25 mg/dL (ref 7–25)
CHLORIDE: 99 mmol/L (ref 98–110)
CO2: 28 mmol/L (ref 20–31)
CREATININE: 1.08 mg/dL (ref 0.70–1.25)
Calcium: 8.7 mg/dL (ref 8.6–10.3)
GFR, Est African American: 86 mL/min (ref >=60)
GFR, Est Non African American: 74 mL/min (ref >=60)
Glucose, Bld: 116 mg/dL — ABNORMAL HIGH (ref 65–99)
POTASSIUM: 3.7 mmol/L (ref 3.5–5.3)
SODIUM: 138 mmol/L (ref 135–146)
Total Bilirubin: 1.4 mg/dL — ABNORMAL HIGH (ref 0.2–1.2)
Total Protein: 5.1 g/dL — ABNORMAL LOW (ref 6.1–8.1)

## 2015-05-29 MED ORDER — PANTOPRAZOLE SODIUM 40 MG PO TBEC: 40.0000 mg | DELAYED_RELEASE_TABLET | Freq: Every day | ORAL | Status: DC

## 2015-05-29 MED ORDER — BUMETANIDE 2 MG PO TABS: 2.0000 mg | ORAL_TABLET | Freq: Two times a day (BID) | ORAL | Status: DC

## 2015-05-29 NOTE — Progress Notes (Signed)
CC: Peter Griffin is a 60 y.o. male is here for Hospitalization Follow-up   Subjective: HPI:  Hospital follow-up for CHF: Since leaving the hospital he tells me that he feels somewhat fatigued with shortness of breath is absent. He continues to have some swelling in the lower extremities bilaterally. He feels that the Bumex did not really start working until last night. That was the first time that he started to notice that he was being more frequently than what he considers is normal. He denies any shortness of breath, cough or chest pain.  His major complaint today is epigastric discomfort that is present after lying down for long periods of time. It's described as a burning sensation that is accompanied by burping. It goes away within a few minutes he takes a Tums however can come back in one or 2 hours. He denies any right upper quadrant pain diarrhea, nausea or vomiting.  Since returning home he denies any fevers, chills, no night sweats. Review Of Systems Outlined In HPI  Past Medical History  Diagnosis Date  . Hypertension   . Diabetes mellitus without complication [facility])     Past Surgical History  Procedure Laterality Date  . Dental surgery    . Tonsillectomy    . Quadriceps tendon repair Left 03/16/2012    Procedure: REPAIR QUADRICEP TENDON;  Surgeon: Eldred Manges, MD;  Location: [facility] OR;  Service: Orthopedics;  Laterality: Left;  . Cardiac surgery     No family history on file.  Social History   Social History  . Marital Status: Married    Spouse Name: N/A  . Number of Children: N/A  . Years of Education: N/A   Occupational History  . Not on file.   Social History Main Topics  . Smoking status: Never Smoker   . Smokeless tobacco: Never Used  . Alcohol Use: No  . Drug Use: No  . Sexual Activity:    Partners: Female   Other Topics Concern  . Not on file   Social History Narrative     Objective: BP 104/81 mmHg  Pulse 96  Wt 178 lb (80.74 kg)  SpO2  100%  General: Alert and Oriented, No Acute Distress HEENT: Pupils equal, round, reactive to light. Conjunctivae clear.  Moist mucous membranes Lungs: Clear to auscultation bilaterally, no wheezing/ronchi/rales.  Comfortable work of breathing. Good air movement. Cardiac: Regular rate and rhythm. Normal S1/S2.  No murmurs, rubs, nor gallops.   Abdomen: Soft nontender Extremities: No peripheral edema.  Strong peripheral pulses.  Mental Status: No depression, anxiety, nor agitation. Skin: Warm and dry.  Assessment & Plan: Peter Griffin was seen today for hospitalization follow-up.  Diagnoses and all orders for this visit:  Ischemic cardiomyopathy  Pleural effusion  Acute on chronic systolic heart failure (HCC) -     Discontinue: bumetanide (BUMEX) 2 MG tablet; Take 1 tablet (2 mg total) by mouth 2 (two) times daily. -     COMPLETE METABOLIC PANEL WITH GFR -     bumetanide (BUMEX) 2 MG tablet; Take 1 tablet (2 mg total) by mouth 2 (two) times daily.  Gastroesophageal reflux disease without esophagitis -     Discontinue: pantoprazole (PROTONIX) 40 MG tablet; Take 1 tablet (40 mg total) by mouth daily. -     pantoprazole (PROTONIX) 40 MG tablet; Take 1 tablet (40 mg total) by mouth daily.   Acute on chronic systolic heart failure: Symptomatically improving however lower extremity edema makes me think that we need to be  a little bit more aggressive with his diuretic. He is currently taking 2, 2 mg tablets of Bumex twice a day. I'd like to check his renal function before adjusting his diuretic. He had elevated LFTs that were improving with diuresis while hospitalized, I like to have his liver function tests repeated today. GERD: Start Protonix  Return in about 4 weeks (around 06/26/2015) for fluid.

## 2015-05-30 ENCOUNTER — Telehealth: Payer: Self-pay | Admitting: Family Medicine

## 2015-05-30 MED ORDER — METOLAZONE 5 MG PO TABS: ORAL_TABLET | ORAL | Status: DC

## 2015-05-30 NOTE — Telephone Encounter (Signed)
Will you please let patient know that his liver function tests are improving and his kidney function is stable.  If he feels that the new fluid pill called bumex is not helping I've sent a Rx of another fluid pill called metolazone that he can use in place of the morning bumex dose.  I'd recommend follow up in one to two weeks to recheck liver and kidney function.

## 2015-05-30 NOTE — Telephone Encounter (Signed)
Pt notified and transferred to the front desk  

## 2015-06-06 ENCOUNTER — Ambulatory Visit (INDEPENDENT_AMBULATORY_CARE_PROVIDER_SITE_OTHER): Payer: BLUE CROSS/BLUE SHIELD | Admitting: Family Medicine

## 2015-06-06 ENCOUNTER — Encounter: Payer: Self-pay | Admitting: Family Medicine

## 2015-06-06 VITALS — BP 91/66 | HR 87 | Wt 150.0 lb

## 2015-06-06 DIAGNOSIS — I872 Venous insufficiency (chronic) (peripheral): Secondary | ICD-10-CM

## 2015-06-06 DIAGNOSIS — I8311 Varicose veins of right lower extremity with inflammation: Secondary | ICD-10-CM | POA: Diagnosis not present

## 2015-06-06 DIAGNOSIS — I5022 Chronic systolic (congestive) heart failure: Secondary | ICD-10-CM

## 2015-06-06 DIAGNOSIS — I8312 Varicose veins of left lower extremity with inflammation: Secondary | ICD-10-CM

## 2015-06-06 DIAGNOSIS — K759 Inflammatory liver disease, unspecified: Secondary | ICD-10-CM | POA: Diagnosis not present

## 2015-06-06 LAB — COMPLETE METABOLIC PANEL WITH GFR
ALT: 61 U/L — AB (ref 9–46)
AST: 30 U/L (ref 10–35)
Albumin: 3.9 g/dL (ref 3.6–5.1)
Alkaline Phosphatase: 100 U/L (ref 40–115)
BILIRUBIN TOTAL: 1 mg/dL (ref 0.2–1.2)
BUN: 37 mg/dL — AB (ref 7–25)
CO2: 34 mmol/L — AB (ref 20–31)
CREATININE: 1.23 mg/dL (ref 0.70–1.25)
Calcium: 9.7 mg/dL (ref 8.6–10.3)
Chloride: 87 mmol/L — ABNORMAL LOW (ref 98–110)
GFR, EST AFRICAN AMERICAN: 73 mL/min (ref >=60)
GFR, Est Non African American: 63 mL/min (ref >=60)
GLUCOSE: 161 mg/dL — AB (ref 65–99)
Potassium: 2.9 mmol/L — ABNORMAL LOW (ref 3.5–5.3)
SODIUM: 134 mmol/L — AB (ref 135–146)
TOTAL PROTEIN: 5.3 g/dL — AB (ref 6.1–8.1)

## 2015-06-06 MED ORDER — TRIAMCINOLONE ACETONIDE 0.1 % EX CREA: TOPICAL_CREAM | CUTANEOUS | Status: DC

## 2015-06-06 MED ORDER — AMBULATORY NON FORMULARY MEDICATION: Status: AC

## 2015-06-06 NOTE — Progress Notes (Signed)
CC: Peter EvesClarence Griffin is a 60 y.o. male is here for No chief complaint on file.   Subjective: HPI:  Follow-up CHF: He is taking zaroxolyn every morning and 4 mg of bumex in the evening. He tells me his breathing feels better and he feels like he is losing weight. He's also noticed some redness that is occurring by the inner side of the ankles. He denies any orthopnea but he does have bilateral ankle edema.. No chest pain or irregular heartbeat.  When he was seen last his liver enzymes were trending downward he denies any right upper quadrant pain or jaundice. Denies any fever or nausea.  Review Of Systems Outlined In HPI  Past Medical History  Diagnosis Date  . Hypertension   . Diabetes mellitus without complication [facility](HCC)     Past Surgical History  Procedure Laterality Date  . Dental surgery    . Tonsillectomy    . Quadriceps tendon repair Left 03/16/2012    Procedure: REPAIR QUADRICEP TENDON;  Surgeon: Eldred MangesMark C Yates, MD;  [facility];  Service: Orthopedics;  Laterality: Left;  . Cardiac surgery     No family history on file.  Social History   Social History  . Marital Status: Married    Spouse Name: N/A  . Number of Children: N/A  . Years of Education: N/A   Occupational History  . Not on file.   Social History Main Topics  . Smoking status: Never Smoker   . Smokeless tobacco: Never Used  . Alcohol Use: No  . Drug Use: No  . Sexual Activity:    Partners: Female   Other Topics Concern  . Not on file   Social History Narrative     Objective: BP 91/66 mmHg  Pulse 87  Wt 150 lb (68.04 kg)  General: Alert and Oriented, No Acute Distress HEENT: Pupils equal, round, reactive to light. Conjunctivae clear.  Moist mucous membranes Lungs: Clear to auscultation bilaterally, no wheezing/ronchi/rales.  Comfortable work of breathing. Good air movement. Cardiac: Regular rate and rhythm. Normal S1/S2.  No murmurs, rubs, nor gallops.   Extremities: Mild peripheral edema of  both ankles with mild venous stasis dermatitis overlying the medial malleoli..  Strong peripheral pulses.  Mental Status: No depression, anxiety, nor agitation. Skin: Warm and dry.  Assessment & Plan: Diagnoses and all orders for this visit:  Venous stasis dermatitis of both lower extremities -     triamcinolone cream (KENALOG) 0.1 %; Apply to affected areas twice a day for up to two weeks, avoid face.  Chronic systolic heart failure (HCC) -     AMBULATORY NON FORMULARY MEDICATION; Knee high compression stockings to be worn during all waking hours. Dx: Congestive heart failure.  Hepatitis -     COMPLETE METABOLIC PANEL WITH GFR  Venous stasis dermatitis secondary to chronic systolic heart failure: Uncontrolled chronic condition therefore encouraged to start using triamcinolone cream at the sites of redness and her knee-high compression stockings during all waking hours. Rechecking kidney function given diuretic use Recent hepatitis: Due for repeat hepatic function panel   Return if symptoms worsen or fail to improve.

## 2015-06-07 ENCOUNTER — Telehealth: Payer: Self-pay | Admitting: Family Medicine

## 2015-06-07 DIAGNOSIS — E876 Hypokalemia: Secondary | ICD-10-CM

## 2015-06-07 MED ORDER — POTASSIUM CHLORIDE CRYS ER 20 MEQ PO TBCR: 20.0000 meq | EXTENDED_RELEASE_TABLET | Freq: Every day | ORAL | Status: DC

## 2015-06-07 NOTE — Telephone Encounter (Signed)
Will you please let patient know that his kidney function and liver function tests have improved.  His potassium level is lower than normal which is commonly seen with the fluid pill regimen that he's now taking.  I'd recommend he begin taking a potassium supplement that I've sent to his pharmacy and I'd like him to have his potassium rechecked early next week. (lab slip in your in box)

## 2015-06-07 NOTE — Telephone Encounter (Signed)
Pt.notified

## 2015-06-15 ENCOUNTER — Telehealth: Payer: Self-pay | Admitting: Family Medicine

## 2015-06-15 DIAGNOSIS — E876 Hypokalemia: Secondary | ICD-10-CM

## 2015-06-15 LAB — BASIC METABOLIC PANEL WITH GFR
BUN: 47 mg/dL — AB (ref 7–25)
CHLORIDE: 86 mmol/L — AB (ref 98–110)
CO2: 34 mmol/L — ABNORMAL HIGH (ref 20–31)
Calcium: 9.7 mg/dL (ref 8.6–10.3)
Creat: 1.28 mg/dL — ABNORMAL HIGH (ref 0.70–1.25)
GFR, EST AFRICAN AMERICAN: 70 mL/min (ref >=60)
GFR, EST NON AFRICAN AMERICAN: 60 mL/min (ref >=60)
Glucose, Bld: 214 mg/dL — ABNORMAL HIGH (ref 65–99)
POTASSIUM: 3.1 mmol/L — AB (ref 3.5–5.3)
SODIUM: 133 mmol/L — AB (ref 135–146)

## 2015-06-15 MED ORDER — POTASSIUM CHLORIDE CRYS ER 20 MEQ PO TBCR: 20.0000 meq | EXTENDED_RELEASE_TABLET | Freq: Two times a day (BID) | ORAL | Status: DC

## 2015-06-15 NOTE — Telephone Encounter (Signed)
Will you please let patient know that his potassium is improving but is still somewhat low due to his new fluid pill regimen.  I'd recommend he start taking his potassium supplement twice a day and follow up with me in one month.

## 2015-06-15 NOTE — Telephone Encounter (Signed)
Pt notified and transferred to the front desk  

## 2015-06-15 NOTE — Telephone Encounter (Signed)
Wife just called and reports that her Mr. Peter Griffin is loosing weight rapidly. This morning he weighed 138lb and when he was here on the 2nd he weighed 150lb. I have yet to call about these results. Please advise.

## 2015-06-15 NOTE — Telephone Encounter (Signed)
Ok please ask him to follow up with me either tomorrow or early next week. This might actually be a good thing since it could reflect that his pleural effusion is improving or it could be a negative thing in that his fluid pill regimen is too potent.

## 2015-06-16 ENCOUNTER — Ambulatory Visit (INDEPENDENT_AMBULATORY_CARE_PROVIDER_SITE_OTHER): Payer: BLUE CROSS/BLUE SHIELD | Admitting: Family Medicine

## 2015-06-16 ENCOUNTER — Encounter: Payer: Self-pay | Admitting: Family Medicine

## 2015-06-16 VITALS — BP 104/74 | HR 89 | Wt 145.0 lb

## 2015-06-16 DIAGNOSIS — R634 Abnormal weight loss: Secondary | ICD-10-CM

## 2015-06-16 DIAGNOSIS — E119 Type 2 diabetes mellitus without complications: Secondary | ICD-10-CM

## 2015-06-16 LAB — HEMOGLOBIN A1C
HEMOGLOBIN A1C: 7.6 % — AB (ref <5.7)
MEAN PLASMA GLUCOSE: 171 mg/dL

## 2015-06-16 NOTE — Progress Notes (Signed)
CC: Peter Griffin is a 60 y.o. male is here for Weight Loss   Subjective: HPI:  Over the past month he's noticed that he is losing about 1 pound and a unintentionally. He denies abdominal pain, change in diet or loose stools. He denies any nauseousness or decreased appetite. He still eats 3 meals a day and snacks. He is waking up at night about 3 times to urinate. He denies polyuria during the day. He denies any shortness of breath or chest discomfort. He denies any motor or sensory disturbances.  Follow-up type 2 diabetes: He is taking metformin twice a day with 100% compliance. No outside blood sugars report however glucose was over 200 on nonfasting a few days ago.   Review Of Systems Outlined In HPI  Past Medical History  Diagnosis Date  . Hypertension   . Diabetes mellitus without complication [facility](HCC)     Past Surgical History  Procedure Laterality Date  . Dental surgery    . Tonsillectomy    . Quadriceps tendon repair Left 03/16/2012    Procedure: REPAIR QUADRICEP TENDON;  Surgeon: Eldred MangesMark C Yates, MD;  [facility];  Service: Orthopedics;  Laterality: Left;  . Cardiac surgery     No family history on file.  Social History   Social History  . Marital Status: Married    Spouse Name: N/A  . Number of Children: N/A  . Years of Education: N/A   Occupational History  . Not on file.   Social History Main Topics  . Smoking status: Never Smoker   . Smokeless tobacco: Never Used  . Alcohol Use: No  . Drug Use: No  . Sexual Activity:    Partners: Female   Other Topics Concern  . Not on file   Social History Narrative     Objective: BP 104/74 mmHg  Pulse 89  Wt 145 lb (65.772 kg)  SpO2 98%  General: Alert and Oriented, No Acute Distress HEENT: Pupils equal, round, reactive to light. Conjunctivae clear. Moist mucous membranes Lungs: Clear to auscultation bilaterally, no wheezing/ronchi/rales.  Comfortable work of breathing. Good air movement. Cardiac: Regular  rate and rhythm. Normal S1/S2.  No murmurs, rubs, nor gallops.   Extremities: No peripheral edema.  Strong peripheral pulses.  Mental Status: No depression, anxiety, nor agitation. Skin: Warm and dry.  Assessment & Plan: Peter Griffin was seen today for weight loss.  Diagnoses and all orders for this visit:  Unintentional weight loss -     Hemoglobin A1c -     PSA  Type 2 diabetes mellitus without complication, without long-term current use of insulin (HCC)   Unintentional weight loss: Suspicion for uncontrolled type 2 diabetes high, checking an A1c today. Additionally screen for prostate cancer. Encouraged to start taking 20 g of whey protein every night before going to bed. Type 2 diabetes: Uncontrolled, Due for A1c.   Return if symptoms worsen or fail to improve.

## 2015-06-17 LAB — PSA: PSA: 1.19 ng/mL (ref <=4.00)

## 2015-06-19 ENCOUNTER — Telehealth: Payer: Self-pay | Admitting: Family Medicine

## 2015-06-19 MED ORDER — SITAGLIPTIN PHOSPHATE 100 MG PO TABS: 100.0000 mg | ORAL_TABLET | Freq: Every day | ORAL | Status: DC

## 2015-06-19 NOTE — Telephone Encounter (Signed)
Will you please let patient know that his PSA prostate test was normal but his 3 month average blood sugar was a little above the goal of less than 7.0. I'd recommend he start on a new additional medication called januvia that I've sent to his [facility].

## 2015-06-19 NOTE — Telephone Encounter (Signed)
Pt.notified

## 2015-06-26 ENCOUNTER — Encounter: Payer: Self-pay | Admitting: Family Medicine

## 2015-06-26 ENCOUNTER — Ambulatory Visit (INDEPENDENT_AMBULATORY_CARE_PROVIDER_SITE_OTHER): Payer: BLUE CROSS/BLUE SHIELD | Admitting: Family Medicine

## 2015-06-26 VITALS — BP 106/74 | HR 81 | Ht 70.0 in | Wt 145.0 lb

## 2015-06-26 DIAGNOSIS — E119 Type 2 diabetes mellitus without complications: Secondary | ICD-10-CM | POA: Diagnosis not present

## 2015-06-26 DIAGNOSIS — I255 Ischemic cardiomyopathy: Secondary | ICD-10-CM

## 2015-06-26 DIAGNOSIS — I5023 Acute on chronic systolic (congestive) heart failure: Secondary | ICD-10-CM | POA: Diagnosis not present

## 2015-06-26 MED ORDER — BUMETANIDE 2 MG PO TABS: 2.0000 mg | ORAL_TABLET | Freq: Two times a day (BID) | ORAL | Status: DC

## 2015-06-26 NOTE — Progress Notes (Signed)
CC: Peter EvesClarence Griffin is a 60 y.o. male is here for Follow-up   Subjective: HPI:  Follow-up type 2 diabetes: He is taking metformin  and began taking Januvia last week without any known side effects. No outside blood sugars to report. He denies polyuria plication or polydipsia. He's no longer losing weight intentionally.  Follow-up ischemic cardiomyopathy: Denies chest pain but still gets shortness of breath with light activity. He believes is eating a little bit better. He is considering a defibrillator sometime in June.   Follow-up acute on chronic systolic heart failure: He is on a beta blocker but is intolerant of ACE inhibitors, he is tolerating losartan. Blood pressures outside of our office are normotensive.  Review Of Systems Outlined In HPI  Past Medical History  Diagnosis Date  . Hypertension   . Diabetes mellitus without complication [facility](HCC)     Past Surgical History  Procedure Laterality Date  . Dental surgery    . Tonsillectomy    . Quadriceps tendon repair Left 03/16/2012    Procedure: REPAIR QUADRICEP TENDON;  Surgeon: Eldred MangesMark C Yates, MD;  [facility];  Service: Orthopedics;  Laterality: Left;  . Cardiac surgery     No family history on file.  Social History   Social History  . Marital Status: Married    Spouse Name: N/A  . Number of Children: N/A  . Years of Education: N/A   Occupational History  . Not on file.   Social History Main Topics  . Smoking status: Never Smoker   . Smokeless tobacco: Never Used  . Alcohol Use: No  . Drug Use: No  . Sexual Activity:    Partners: Female   Other Topics Concern  . Not on file   Social History Narrative     Objective: BP 106/74 mmHg  Pulse 81  Ht 5\' 10"  (1.778 m)  Wt 145 lb (65.772 kg)  BMI 20.81 kg/m2  General: Alert and Oriented, No Acute Distress HEENT: Pupils equal, round, reactive to light. Conjunctivae clear.Moist mucous membranes lungs: Clear to auscultation bilaterally, no wheezing/ronchi/rales.   Comfortable work of breathing. Good air movement. Cardiac: Regular rate and rhythm. Normal S1/S2.  No murmurs, rubs, nor gallops.   Extremities: No peripheral edema.  Strong peripheral pulses.  Mental Status: No depression, anxiety, nor agitation. Skin: Warm and dry.  Assessment & Plan: Peter FavreClarence was seen today for follow-up.  Diagnoses and all orders for this visit:  Type 2 diabetes mellitus without complication, without long-term current use of insulin (HCC)  Ischemic cardiomyopathy  Acute on chronic systolic heart failure (HCC) -     bumetanide (BUMEX) 2 MG tablet; Take 1 tablet (2 mg total) by mouth 2 (two) times daily.    type 2 diabetes: Clinically controlled to for A1c in August   ischemic cardiomyopathy: Still symptomatic, writing him out of work until July 23rd since this is the last day that short-term disability is available to him. Chronic systolic heart failure currently controlled with ACE inhibitor, beta blocker and diuretics.   Return in about 3 months (around 09/26/2015), or Diabetes.

## 2015-07-06 ENCOUNTER — Telehealth: Payer: Self-pay | Admitting: Family Medicine

## 2015-07-06 DIAGNOSIS — I509 Heart failure, unspecified: Secondary | ICD-10-CM

## 2015-07-06 NOTE — Telephone Encounter (Signed)
Will you please let Mr. Peter Griffin know that the form he's asked me to fill out requires a exam by a physical therapist that's specifically called a Functional Capacity Evaluation.  Once you let him know about this can you please place a physical therapy referral with "Functional Capacity Evaluation" listed in the comments section.  Dx: Congestive heart failure.

## 2015-07-07 NOTE — Telephone Encounter (Signed)
Pt.notified

## 2015-07-07 NOTE — Telephone Encounter (Signed)
Left vm to return call to clinic

## 2015-07-07 NOTE — Telephone Encounter (Signed)
Referral placed.

## 2015-07-11 ENCOUNTER — Ambulatory Visit: Payer: Self-pay | Admitting: Family Medicine

## 2015-07-18 ENCOUNTER — Ambulatory Visit: Payer: BLUE CROSS/BLUE SHIELD | Attending: Family Medicine

## 2015-07-18 DIAGNOSIS — M6281 Muscle weakness (generalized): Secondary | ICD-10-CM | POA: Diagnosis not present

## 2015-07-18 DIAGNOSIS — R262 Difficulty in walking, not elsewhere classified: Secondary | ICD-10-CM

## 2015-07-18 NOTE — Therapy (Signed)
[facility] [address] [place], [place], [address] Phone: [phone]   Fax:  [phone]  Physical Therapy Evaluation  Patient Details  Name: Peter Griffin MRN: [id number] Date of Birth: Jun 13, 1955 Referring Provider: Laren Boom ,MD  Encounter Date: 07/18/2015      PT End of Session - 07/18/15 1205    Visit Number 1   Number of Visits 1   Authorization Type [employer]   PT Start Time 0750   PT Stop Time 1145   PT Time Calculation (min) 235 min   Activity Tolerance Patient tolerated treatment well;Patient limited by fatigue   Behavior During Therapy [facility] for tasks assessed/performed      Past Medical History  Diagnosis Date  . Hypertension   . Diabetes mellitus without complication [facility])     Past Surgical History  Procedure Laterality Date  . Dental surgery    . Tonsillectomy    . Quadriceps tendon repair Left 03/16/2012    Procedure: REPAIR QUADRICEP TENDON;  Surgeon: Eldred Manges, MD;  Location: [facility] OR;  Service: Orthopedics;  Laterality: Left;  . Cardiac surgery      There were no vitals filed for this visit.       Subjective Assessment - 07/18/15 1201    Subjective Peter Griffin is here for FCE with history of CHF , MI and HCC limiting his ability to work.    Patient Stated Goals extend disability   Currently in Pain? No/denies            [facility] PT Assessment - 07/18/15 0001    Assessment   Medical Diagnosis CHF, MI   Referring Provider Laren Boom ,MD   Onset Date/Surgical Date --  MI 09/2014   Precautions   Precautions None   Restrictions   Weight Bearing Restrictions No                           PT Education - 07/18/15 1205    Education provided Yes   Education Details management of post FCE soreness   Person(s) Educated Patient   Methods Explanation   Comprehension Verbalized understanding        SEE RESULTS OF FCE SCANNED INTO EPIC            Plan -  07/18/15 0747    Clinical Impression Statement Peter Griffin   completed the FCE with overall work rating of light . It is not clear how this fits into his normal job duties.    PT Next Visit Plan No follow up    Consulted and Agree with Plan of Care Patient      Patient will benefit from skilled therapeutic intervention in order to improve the following deficits and impairments:     Visit Diagnosis: Muscle weakness (generalized) - Plan: PT plan of care cert/re-cert  Difficulty in walking, not elsewhere classified - Plan: PT plan of care cert/re-cert     Problem List Patient Active Problem List   Diagnosis Date Noted  . Acute on chronic systolic heart failure (HCC) 05/29/2015  . Cholecystitis 05/22/2015  . Abdominal pain 03/21/2015  . Mitral regurgitation 02/08/2015  . Pleural effusion 01/20/2015  . Ischemic cardiomyopathy 10/21/2014  . STEMI (ST elevation myocardial infarction) August 2016 10/07/2014  . Hyperlipidemia 01/21/2014  . Essential hypertension 01/20/2014  . Type 2 diabetes mellitus (HCC) 01/20/2014  . Rupture of quadriceps tendon 03/16/2012    Class: Diagnosis of    Peter Griffin,  Bertram MillardStephen Griffin  PT 07/18/2015, 12:10 PM  [facility] [address] [place], [place], [address] Phone: [phone]   Fax:  [phone]  Name: Gabriela EvesClarence Griffin MRN: [id number] Date of Birth: 07/15/1955

## 2015-07-18 NOTE — Patient Instructions (Signed)
Limit strenous activity next 2 days and take meds if needed for pain/soreness

## 2015-07-30 ENCOUNTER — Other Ambulatory Visit: Payer: Self-pay | Admitting: Family Medicine

## 2015-08-11 ENCOUNTER — Other Ambulatory Visit: Payer: Self-pay

## 2015-08-11 MED ORDER — METFORMIN HCL 1000 MG PO TABS: 1000.0000 mg | ORAL_TABLET | Freq: Two times a day (BID) | ORAL | Status: DC

## 2015-08-31 ENCOUNTER — Telehealth: Payer: Self-pay | Admitting: Family Medicine

## 2015-08-31 NOTE — Telephone Encounter (Signed)
Pt stated you had filled out some long term disability forms for him put a return to work date of July 23rd. Pt said that he was not planning on going back due to his health condition and wants to know if you can right a letter stating it is not recommended for him to continue work due to his condition. Thanks

## 2015-09-04 NOTE — Telephone Encounter (Signed)
Will you please let patient know that I'm happy to print out a letter for this.  If he's interested in pursuing permanent disability he'll need to meet up with a disability attorney to guide him through the process.  Please let me know if he would like the names of some local disability attorneys.

## 2015-09-04 NOTE — Telephone Encounter (Signed)
Pt notified Appointment for tomorrow is canceled

## 2015-09-05 ENCOUNTER — Ambulatory Visit: Payer: Self-pay | Admitting: Family Medicine

## 2015-09-06 ENCOUNTER — Ambulatory Visit (INDEPENDENT_AMBULATORY_CARE_PROVIDER_SITE_OTHER): Payer: BLUE CROSS/BLUE SHIELD | Admitting: Family Medicine

## 2015-09-06 ENCOUNTER — Encounter: Payer: Self-pay | Admitting: Family Medicine

## 2015-09-06 VITALS — BP 104/72 | HR 80 | Wt 157.0 lb

## 2015-09-06 DIAGNOSIS — I255 Ischemic cardiomyopathy: Secondary | ICD-10-CM | POA: Diagnosis not present

## 2015-09-06 NOTE — Progress Notes (Signed)
CC: Peter Griffin is a 60 y.o. male is here for health concerns   Subjective: HPI:  Patient is pursuing disability for cardiovascular disease and his lawyer's office would like a note with my opinion on the matter. Patient is comfortable at rest however Less than ordinary activity causes fatigue and shortness of breath. Ever since going on short-term disability he has not needed any readmission or hospitalization for heart failure. He feels like his strength endurance and breathing has reached a plateau over the past 3-4 months. He denies any chest pain  Review Of Systems Outlined In HPI  Past Medical History:  Diagnosis Date  . Diabetes mellitus without complication (HCC)   . Hypertension     Past Surgical History:  Procedure Laterality Date  . CARDIAC SURGERY    . DENTAL SURGERY    . QUADRICEPS TENDON REPAIR Left 03/16/2012   Procedure: REPAIR QUADRICEP TENDON;  Surgeon: Eldred Manges, MD;  Location: [facility] OR;  Service: Orthopedics;  Laterality: Left;  . TONSILLECTOMY     No family history on file.  Social History   Social History  . Marital status: Married    Spouse name: N/A  . Number of children: N/A  . Years of education: N/A   Occupational History  . Not on file.   Social History Main Topics  . Smoking status: Never Smoker  . Smokeless tobacco: Never Used  . Alcohol use No  . Drug use: No  . Sexual activity: Yes    Partners: Female   Other Topics Concern  . Not on file   Social History Narrative  . No narrative on file     Objective: BP 104/72   Pulse 80   Wt 157 lb (71.2 kg)   BMI 22.53 kg/m   General: Alert and Oriented, No Acute Distress Lungs: Clear to auscultation bilaterally, no wheezing/ronchi/rales.  Comfortable work of breathing. Good air movement. Cardiac: Regular rate and rhythm. Normal S1/S2.  No murmurs, rubs, nor gallops.   Extremities: No peripheral edema.  Strong peripheral pulses.  Mental Status: No depression, anxiety, nor  agitation. Skin: Warm and dry.  Assessment & Plan: Peter Griffin was seen today for health concerns.  Diagnoses and all orders for this visit:  Ischemic cardiomyopathy   I'm in support of him seeking disability, I'm not sure if he qualifies for complete disability I will let the legal system sort this one out but I definitely think that his job at [employer] is much too strenuous for what he is able to do at this point in his life. Time was taken to write a letter for him in the presence of the patient.  15 minutes spent face-to-face during visit today of which at least 50% was counseling or coordinating care regarding: 1. Ischemic cardiomyopathy       No Follow-up on file.

## 2015-09-13 ENCOUNTER — Encounter: Payer: Self-pay | Admitting: Family Medicine

## 2015-09-13 NOTE — Telephone Encounter (Signed)
Error

## 2015-09-26 ENCOUNTER — Encounter: Payer: Self-pay | Admitting: Family Medicine

## 2015-09-26 ENCOUNTER — Ambulatory Visit (INDEPENDENT_AMBULATORY_CARE_PROVIDER_SITE_OTHER): Payer: BLUE CROSS/BLUE SHIELD | Admitting: Family Medicine

## 2015-09-26 VITALS — BP 100/68 | HR 93 | Wt 158.0 lb

## 2015-09-26 DIAGNOSIS — Z1159 Encounter for screening for other viral diseases: Secondary | ICD-10-CM

## 2015-09-26 DIAGNOSIS — I255 Ischemic cardiomyopathy: Secondary | ICD-10-CM | POA: Diagnosis not present

## 2015-09-26 DIAGNOSIS — E119 Type 2 diabetes mellitus without complications: Secondary | ICD-10-CM

## 2015-09-26 DIAGNOSIS — R252 Cramp and spasm: Secondary | ICD-10-CM

## 2015-09-26 LAB — POTASSIUM: POTASSIUM: 2.5 mmol/L — AB (ref 3.5–5.3)

## 2015-09-26 LAB — POCT GLYCOSYLATED HEMOGLOBIN (HGB A1C): HEMOGLOBIN A1C: 7.5

## 2015-09-26 NOTE — Progress Notes (Signed)
CC: Peter Griffin is a 60 y.o. male is here for Hyperglycemia   Subjective: HPI:  20mEq in the mornings only  Follow-up type 2 diabetes: He is taking Januvia and metformin with 100% compliance. No outside blood sugars to report. He denies vision disturbance, poorly healing wounds, or polyuria.  Follow-up ischemic cardiomyopathy: He is currently taking a ARB and beta blocker. He denies any fluid retention, shortness of breath with rest or chest pain. Ever since starting on furosemide he's had take magnesium supplement to reduce his cramping in his arms and legs. It will occasionally wake him up all sleeping but he never gets it with activity. He is currently taking 20 mEq of potassium in the mornings only.   He tells me he's never been tested for hepatitis C.    Review Of Systems Outlined In HPI  Past Medical History:  Diagnosis Date  . Diabetes mellitus without complication (HCC)   . Hypertension     Past Surgical History:  Procedure Laterality Date  . CARDIAC SURGERY    . DENTAL SURGERY    . QUADRICEPS TENDON REPAIR Left 03/16/2012   Procedure: REPAIR QUADRICEP TENDON;  Surgeon: Eldred MangesMark C Yates, MD;  [facility];  Service: Orthopedics;  Laterality: Left;  . TONSILLECTOMY     No family history on file.  Social History   Social History  . Marital status: Married    Spouse name: N/A  . Number of children: N/A  . Years of education: N/A   Occupational History  . Not on file.   Social History Main Topics  . Smoking status: Never Smoker  . Smokeless tobacco: Never Used  . Alcohol use No  . Drug use: No  . Sexual activity: Yes    Partners: Female   Other Topics Concern  . Not on file   Social History Narrative  . No narrative on file     Objective: BP 100/68   Pulse 93   Wt 158 lb (71.7 kg)   BMI 22.67 kg/m   General: Alert and Oriented, No Acute Distress HEENT: Pupils equal, round, reactive to light. Conjunctivae clear.  Moist mucous membranes Lungs:  Clear to auscultation bilaterally, no wheezing/ronchi/rales.  Comfortable work of breathing. Good air movement. Cardiac: Regular rate and rhythm. Normal S1/S2.  No murmurs, rubs, nor gallops.   Extremities: No peripheral edema.  Strong peripheral pulses.  Mental Status: No depression, anxiety, nor agitation. Skin: Warm and dry.  Assessment & Plan: Peter Griffin was seen today for hyperglycemia.  Diagnoses and all orders for this visit:  Type 2 diabetes mellitus without complication, without long-term current use of insulin (HCC) -     POCT HgB A1C  Ischemic cardiomyopathy  Need for hepatitis C screening test -     Hepatitis C antibody  Cramps of left lower extremity -     Potassium   Type 2 diabetes: A1c of 7.5, uncontrolled chronic condition, I've encouraged him to switch to metformin extended release by starting on Janumet XR however he politely declines. Ischemic cardiomyopathy: Stable continue beta blocker and ARB Checking potassium level given recent cramps to see if we need to adjust his potassium supplement.   Return in about 3 months (around 12/27/2015).

## 2015-09-27 ENCOUNTER — Telehealth: Payer: Self-pay | Admitting: Family Medicine

## 2015-09-27 DIAGNOSIS — E876 Hypokalemia: Secondary | ICD-10-CM

## 2015-09-27 LAB — HEPATITIS C ANTIBODY: HCV Ab: NEGATIVE

## 2015-09-27 MED ORDER — POTASSIUM CHLORIDE CRYS ER 20 MEQ PO TBCR: 20.0000 meq | EXTENDED_RELEASE_TABLET | Freq: Two times a day (BID) | ORAL | 3 refills | Status: DC

## 2015-09-27 NOTE — Telephone Encounter (Signed)
Pt notified of results and recommendations.

## 2015-09-27 NOTE — Telephone Encounter (Signed)
Also his hepatitis C test was normal.

## 2015-09-27 NOTE — Telephone Encounter (Signed)
Will you please let patient know that I'd recommend he start taking of a potassium supplement twice a day to help his potassium level.  I left him a message last night about his potassium being low which is causing his cramping.  I sent [facility] Rx [facility] to [facility] and would recommend he have this value recheked in one week, a new lab slip is in your in box.

## 2015-09-28 ENCOUNTER — Encounter: Payer: Self-pay | Admitting: Family Medicine

## 2015-09-28 ENCOUNTER — Other Ambulatory Visit: Payer: Self-pay | Admitting: Family Medicine

## 2015-09-28 DIAGNOSIS — E876 Hypokalemia: Secondary | ICD-10-CM

## 2015-10-04 LAB — HEMOGLOBIN A1C
Hgb A1c MFr Bld: 7.3 % — ABNORMAL HIGH (ref <5.7)
Mean Plasma Glucose: 163 mg/dL

## 2015-10-05 ENCOUNTER — Telehealth: Payer: Self-pay | Admitting: Emergency Medicine

## 2015-10-05 ENCOUNTER — Telehealth: Payer: Self-pay | Admitting: Family Medicine

## 2015-10-05 DIAGNOSIS — E876 Hypokalemia: Secondary | ICD-10-CM

## 2015-10-05 LAB — POTASSIUM: POTASSIUM: 2.9 mmol/L — AB (ref 3.5–5.3)

## 2015-10-05 MED ORDER — POTASSIUM CHLORIDE CRYS ER 20 MEQ PO TBCR: 20.0000 meq | EXTENDED_RELEASE_TABLET | Freq: Three times a day (TID) | ORAL | 1 refills | Status: AC

## 2015-10-05 NOTE — Progress Notes (Signed)
Spoke with patient and told him that his potassium is improving but not quite in the normal range. Dr.Hommel recommends taking his potassium supplement three times a day, this could be done with breakfast lunch and dinner.  Please return for a repeat potassium in one week. Patient understands and will comply.pak

## 2015-10-05 NOTE — Telephone Encounter (Signed)
Will you please let patient know that his potassium is improving but not quite in the normal range.  I'd recommend taking his potassium supplement three times a day, this could be done with breakfast lunch and dinner.  Please return for a repeat potassium in one week.

## 2015-10-27 NOTE — Telephone Encounter (Signed)
Labs ready for pickup. Follow with me soon.

## 2015-10-27 NOTE — Telephone Encounter (Signed)
Mr. Peter Griffin stopped in to get paperwork to get his labs completed. I did not see anything in the drawer up here for him, and I did not see anything in his chart for lab orders. He said he was told he needed the labs done to monitor his medication. He is a patient transferring from Dr. Ivan AnchorsHommel. Thanks!

## 2015-10-28 LAB — MAGNESIUM: MAGNESIUM: 2.1 mg/dL (ref 1.5–2.5)

## 2015-10-28 LAB — BASIC METABOLIC PANEL
BUN: 36 mg/dL — ABNORMAL HIGH (ref 7–25)
CALCIUM: 9.4 mg/dL (ref 8.6–10.3)
CO2: 33 mmol/L — ABNORMAL HIGH (ref 20–31)
CREATININE: 1.57 mg/dL — AB (ref 0.70–1.25)
Chloride: 91 mmol/L — ABNORMAL LOW (ref 98–110)
Glucose, Bld: 295 mg/dL — ABNORMAL HIGH (ref 65–99)
Potassium: 3 mmol/L — ABNORMAL LOW (ref 3.5–5.3)
SODIUM: 136 mmol/L (ref 135–146)

## 2015-11-01 ENCOUNTER — Ambulatory Visit (INDEPENDENT_AMBULATORY_CARE_PROVIDER_SITE_OTHER): Payer: BLUE CROSS/BLUE SHIELD | Admitting: Family Medicine

## 2015-11-01 VITALS — BP 97/67 | HR 80 | Wt 160.0 lb

## 2015-11-01 DIAGNOSIS — E876 Hypokalemia: Secondary | ICD-10-CM

## 2015-11-01 DIAGNOSIS — Z23 Encounter for immunization: Secondary | ICD-10-CM

## 2015-11-01 MED ORDER — SPIRONOLACTONE 25 MG PO TABS: 25.0000 mg | ORAL_TABLET | Freq: Every day | ORAL | 0 refills | Status: DC

## 2015-11-01 NOTE — Patient Instructions (Signed)
Thank you for coming in today. Start spironolactone. Get labs Friday and see me in 1 week.  Call or go to the emergency room if you get worse, have trouble breathing, have chest pains, or palpitations.

## 2015-11-01 NOTE — Progress Notes (Signed)
Peter Griffin is a 60 y.o. male who presents to [name]: [facility] today for hypokalemia. Patient has a history of heart failure and takes both Bumex and Zaroxolyn for edema control. However this causes significant hypokalemia. His hypokalemia has been treated with potassium 20 mEq 3 times daily, as well as with losartan. His potassium has been as low as 2.5 recently. He denies any significant cramping fevers or chills.   Past Medical History:  Diagnosis Date  . Diabetes mellitus without complication (HCC)   . Hypertension    Past Surgical History:  Procedure Laterality Date  . CARDIAC SURGERY    . DENTAL SURGERY    . QUADRICEPS TENDON REPAIR Left 03/16/2012   Procedure: REPAIR QUADRICEP TENDON;  Surgeon: Eldred Manges, MD;  Location: [facility] OR;  Service: Orthopedics;  Laterality: Left;  . TONSILLECTOMY     Social History  Substance Use Topics  . Smoking status: Never Smoker  . Smokeless tobacco: Never Used  . Alcohol use No   family history is not on file.  ROS as above:  Medications: Current Outpatient Prescriptions  Medication Sig Dispense Refill  . AMBULATORY NON FORMULARY MEDICATION Knee high compression stockings to be worn during all waking hours. Dx: Congestive heart failure. 2 Units 11  . aspirin 81 MG tablet Take 81 mg by mouth daily.    Marland Kitchen atorvastatin (LIPITOR) 80 MG tablet Take 1 tablet (80 mg total) by mouth daily.    . bumetanide (BUMEX) 2 MG tablet Take 1 tablet (2 mg total) by mouth 2 (two) times daily. 60 tablet 2  . KRILL OIL PO Take 350 mg by mouth.    . losartan (COZAAR) 100 MG tablet Take 1 tablet (100 mg total) by mouth daily. 90 tablet 3  . Lutein 6 MG CAPS Take by mouth.    . metFORMIN (GLUCOPHAGE) 1000 MG tablet Take 1 tablet (1,000 mg total) by mouth 2 (two) times daily. 180 tablet 1  . metolazone (ZAROXOLYN) 5 MG tablet TAKE 1 TABLET BY  MOUTH EVERY MORNING REPLACING AM BUMEX DOSE IF NEEDED FOR ANKLE SWELLING OR SHORTNE 20 tablet 2  . metoprolol succinate (TOPROL-XL) 50 MG 24 hr tablet Take 2 tablets (100 mg total) by mouth daily. Take with or immediately following a meal. 90 tablet 3  . multivitamin-lutein (OCUVITE-LUTEIN) CAPS Take 1 capsule by mouth daily.    . pantoprazole (PROTONIX) 40 MG tablet Take 1 tablet (40 mg total) by mouth daily. 30 tablet 0  . potassium chloride SA (K-DUR,KLOR-CON) 20 MEQ tablet Take 1 tablet (20 mEq total) by mouth 3 (three) times daily. 90 tablet 1  . sitaGLIPtin (JANUVIA) 100 MG tablet Take 1 tablet (100 mg total) by mouth daily. 90 tablet 1  . ticagrelor (BRILINTA) 90 MG TABS tablet Take 1 tablet (90 mg total) by mouth 2 (two) times daily. 60 tablet   . spironolactone (ALDACTONE) 25 MG tablet Take 1 tablet (25 mg total) by mouth daily. 7 tablet 0   No current facility-administered medications for this visit.    Allergies  Allergen Reactions  . Lisinopril     SOB     Exam:  BP 97/67   Pulse 80   Wt 160 lb (72.6 kg)   BMI 22.96 kg/m  Gen: Well NAD HEENT: EOMI,  MMM Lungs: Normal work of breathing. CTABL Heart: RRR no MRG Abd: NABS, Soft. Nondistended, Nontender Exts: Brisk capillary refill, warm and well perfused. No  edema Diabetic Foot Exam - Simple   Simple Foot Form Diabetic Foot exam was performed with the following findings:  Yes 11/01/2015  9:25 AM  Visual Inspection No deformities, no ulcerations, no other skin breakdown bilaterally:  Yes Sensation Testing Intact to touch and monofilament testing bilaterally:  Yes Pulse Check Posterior Tibialis and Dorsalis pulse intact bilaterally:  Yes Comments        Chemistry      Component Value Date/Time   NA 136 10/27/2015 1527   NA 143 04/21/2014   K 3.0 (L) 10/27/2015 1527   CL 91 (L) 10/27/2015 1527   CO2 33 (H) 10/27/2015 1527   BUN 36 (H) 10/27/2015 1527   BUN 10 04/21/2014   CREATININE 1.57 (H) 10/27/2015 1527     GLU 97 04/21/2014      Component Value Date/Time   CALCIUM 9.4 10/27/2015 1527   ALKPHOS 100 06/06/2015 0940   AST 30 06/06/2015 0940   ALT 61 (H) 06/06/2015 0940   BILITOT 1.0 06/06/2015 0940       No results found for this or any previous visit (from the past 24 hour(s)). No results found.    Assessment and Plan: 60 y.o. male with typically kalemia: Difficult to control. I don't think potassium repletion orally will be enough. Plan to add spironolactone and recheck metabolic panel in 2 days. Return to clinic in one week. Warned about hyperkalemia as well as hypotension.   Orders Placed This Encounter  Procedures  . Pneumococcal polysaccharide vaccine 23-valent greater than or equal to 2yo subcutaneous/IM  . Tdap vaccine greater than or equal to 7yo IM  . Basic metabolic panel    Discussed warning signs or symptoms. Please see discharge instructions. Patient expresses understanding.

## 2015-11-04 LAB — BASIC METABOLIC PANEL
BUN: 35 mg/dL — ABNORMAL HIGH (ref 7–25)
CO2: 33 mmol/L — ABNORMAL HIGH (ref 20–31)
Calcium: 9.6 mg/dL (ref 8.6–10.3)
Chloride: 91 mmol/L — ABNORMAL LOW (ref 98–110)
Creat: 1.45 mg/dL — ABNORMAL HIGH (ref 0.70–1.25)
GLUCOSE: 208 mg/dL — AB (ref 65–99)
POTASSIUM: 2.8 mmol/L — AB (ref 3.5–5.3)
SODIUM: 138 mmol/L (ref 135–146)

## 2015-11-08 ENCOUNTER — Encounter: Payer: Self-pay | Admitting: Family Medicine

## 2015-11-08 ENCOUNTER — Ambulatory Visit (INDEPENDENT_AMBULATORY_CARE_PROVIDER_SITE_OTHER): Payer: Self-pay | Admitting: Family Medicine

## 2015-11-08 VITALS — BP 107/73 | HR 79 | Wt 161.0 lb

## 2015-11-08 DIAGNOSIS — I10 Essential (primary) hypertension: Secondary | ICD-10-CM

## 2015-11-08 DIAGNOSIS — Z23 Encounter for immunization: Secondary | ICD-10-CM

## 2015-11-08 DIAGNOSIS — E876 Hypokalemia: Secondary | ICD-10-CM

## 2015-11-08 LAB — BASIC METABOLIC PANEL WITH GFR
BUN: 36 mg/dL — AB (ref 7–25)
CALCIUM: 9.8 mg/dL (ref 8.6–10.3)
CO2: 34 mmol/L — AB (ref 20–31)
CREATININE: 1.6 mg/dL — AB (ref 0.70–1.25)
Chloride: 92 mmol/L — ABNORMAL LOW (ref 98–110)
GFR, EST NON AFRICAN AMERICAN: 46 mL/min — AB (ref >=60)
GFR, Est African American: 53 mL/min — ABNORMAL LOW (ref >=60)
GLUCOSE: 210 mg/dL — AB (ref 65–99)
Potassium: 3.2 mmol/L — ABNORMAL LOW (ref 3.5–5.3)
SODIUM: 139 mmol/L (ref 135–146)

## 2015-11-08 MED ORDER — METOLAZONE 5 MG PO TABS: ORAL_TABLET | ORAL | 2 refills | Status: DC

## 2015-11-08 MED ORDER — SPIRONOLACTONE 25 MG PO TABS: 25.0000 mg | ORAL_TABLET | Freq: Every day | ORAL | 1 refills | Status: DC

## 2015-11-08 NOTE — Progress Notes (Signed)
Peter EvesClarence Griffin is a 60 y.o. male who presents [facility] today for follow-up heart failure and hypokalemia.  Patient has a history of heart failure and requires multiple different potassium wasting diuretics to have good symptom control. We have been struggling to maintain adequate potassium levels with oral potassium repletion for sometime now. A week ago he was seen and was started on spironolactone. Lab recheck in a few days after spironolactone was started showed still low potassium levels. On recheck he's feeling well with no chest pains or palpitations shortness of breath lightheadedness or dizziness. He notes his cramping has improved at night. He feels well.   Past Medical History:  Diagnosis Date  . Diabetes mellitus without complication (HCC)   . Hypertension    Past Surgical History:  Procedure Laterality Date  . CARDIAC SURGERY    . DENTAL SURGERY    . QUADRICEPS TENDON REPAIR Left 03/16/2012   Procedure: REPAIR QUADRICEP TENDON;  Surgeon: Eldred MangesMark C Yates, MD;  [facility];  Service: Orthopedics;  Laterality: Left;  . TONSILLECTOMY     Social History  Substance Use Topics  . Smoking status: Never Smoker  . Smokeless tobacco: Never Used  . Alcohol use No   family history is not on file.  ROS as above:  Medications: Current Outpatient Prescriptions  Medication Sig Dispense Refill  . AMBULATORY NON FORMULARY MEDICATION Knee high compression stockings to be worn during all waking hours. Dx: Congestive heart failure. 2 Units 11  . aspirin 81 MG tablet Take 81 mg by mouth daily.    Marland Kitchen. atorvastatin (LIPITOR) 80 MG tablet Take 1 tablet (80 mg total) by mouth daily.    . bumetanide (BUMEX) 2 MG tablet Take 1 tablet (2 mg total) by mouth 2 (two) times daily. 60 tablet 2  . KRILL OIL PO Take 350 mg by mouth.    . losartan (COZAAR) 100 MG tablet Take 1  tablet (100 mg total) by mouth daily. 90 tablet 3  . Lutein 6 MG CAPS Take by mouth.    . metFORMIN (GLUCOPHAGE) 1000 MG tablet Take 1 tablet (1,000 mg total) by mouth 2 (two) times daily. 180 tablet 1  . metolazone (ZAROXOLYN) 5 MG tablet TAKE 1 TABLET BY MOUTH EVERY MORNING REPLACING AM BUMEX DOSE IF NEEDED FOR ANKLE SWELLING 30 tablet 2  . metoprolol succinate (TOPROL-XL) 50 MG 24 hr tablet Take 2 tablets (100 mg total) by mouth daily. Take with or immediately following a meal. 90 tablet 3  . multivitamin-lutein (OCUVITE-LUTEIN) CAPS Take 1 capsule by mouth daily.    . pantoprazole (PROTONIX) 40 MG tablet Take 1 tablet (40 mg total) by mouth daily. 30 tablet 0  . potassium chloride SA (K-DUR,KLOR-CON) 20 MEQ tablet Take 1 tablet (20 mEq total) by mouth 3 (three) times daily. 90 tablet 1  . sitaGLIPtin (JANUVIA) 100 MG tablet Take 1 tablet (100 mg total) by mouth daily. 90 tablet 1  . spironolactone (ALDACTONE) 25 MG tablet Take 1 tablet (25 mg total) by mouth daily. 30 tablet 1  . ticagrelor (BRILINTA) 90 MG TABS tablet Take 1 tablet (90 mg total) by mouth 2 (two) times daily. 60 tablet    No current facility-administered medications for this visit.    Allergies  Allergen Reactions  . Lisinopril     SOB     Exam:  BP 107/73   Pulse 79   Wt 161 lb (73 kg)  BMI 23.10 kg/m  Gen: Well NAD HEENT: EOMI,  MMM Lungs: Normal work of breathing. CTABL Heart: RRR no MRG Abd: NABS, Soft. Nondistended, Nontender Exts: Brisk capillary refill, warm and well perfused. No edema    Chemistry      Component Value Date/Time   NA 138 11/01/2015 1138   NA 143 04/21/2014   K 2.8 (L) 11/01/2015 1138   CL 91 (L) 11/01/2015 1138   CO2 33 (H) 11/01/2015 1138   BUN 35 (H) 11/01/2015 1138   BUN 10 04/21/2014   CREATININE 1.45 (H) 11/01/2015 1138   GLU 97 04/21/2014      Component Value Date/Time   CALCIUM 9.6 11/01/2015 1138   ALKPHOS 100 06/06/2015 0940   AST 30 06/06/2015 0940   ALT 61 (H)  06/06/2015 0940   BILITOT 1.0 06/06/2015 0940        Assessment and Plan: 60 y.o. male with Hypokalemia. Doing well. Plan to recheck basic metabolic panel today and continue spironolactone. Recheck in 3-4 weeks for fasting labs.  Influenza vaccine given prior to discharge. Orders Placed This Encounter  Procedures  . BASIC METABOLIC PANEL WITH GFR    Discussed warning signs or symptoms. Please see discharge instructions. Patient expresses understanding.

## 2015-11-08 NOTE — Patient Instructions (Signed)
Thank you for coming in today. Get labs today.  Return in 3-4 weeks for fasting lab and visit recheck.   Call or go to the emergency room if you get worse, have trouble breathing, have chest pains, or palpitations.

## 2015-11-08 NOTE — Addendum Note (Signed)
Addended by: Willey BladeUNNINGHAM, RHONDA C on: 11/08/2015 12:41 PM   Modules accepted: Orders

## 2015-12-01 ENCOUNTER — Other Ambulatory Visit: Payer: Self-pay | Admitting: *Deleted

## 2015-12-01 ENCOUNTER — Ambulatory Visit: Payer: Self-pay | Admitting: Sports Medicine

## 2015-12-01 DIAGNOSIS — I5023 Acute on chronic systolic (congestive) heart failure: Secondary | ICD-10-CM

## 2015-12-01 MED ORDER — BUMETANIDE 2 MG PO TABS
2.0000 mg | ORAL_TABLET | Freq: Two times a day (BID) | ORAL | 2 refills | Status: DC
Start: 2015-12-01 — End: 2016-01-12

## 2015-12-01 NOTE — Progress Notes (Signed)
Ok per Dr. Denyse Amassorey to fill Bumetanide 2 mg.

## 2015-12-04 ENCOUNTER — Other Ambulatory Visit: Payer: Self-pay | Admitting: Family Medicine

## 2015-12-04 DIAGNOSIS — E876 Hypokalemia: Secondary | ICD-10-CM

## 2015-12-04 DIAGNOSIS — I10 Essential (primary) hypertension: Secondary | ICD-10-CM

## 2015-12-13 ENCOUNTER — Ambulatory Visit (INDEPENDENT_AMBULATORY_CARE_PROVIDER_SITE_OTHER): Payer: BLUE CROSS/BLUE SHIELD | Admitting: Family Medicine

## 2015-12-13 ENCOUNTER — Encounter: Payer: Self-pay | Admitting: Family Medicine

## 2015-12-13 VITALS — BP 96/68 | HR 93 | Wt 160.0 lb

## 2015-12-13 DIAGNOSIS — I10 Essential (primary) hypertension: Secondary | ICD-10-CM | POA: Diagnosis not present

## 2015-12-13 DIAGNOSIS — I255 Ischemic cardiomyopathy: Secondary | ICD-10-CM

## 2015-12-13 DIAGNOSIS — E782 Mixed hyperlipidemia: Secondary | ICD-10-CM

## 2015-12-13 DIAGNOSIS — E119 Type 2 diabetes mellitus without complications: Secondary | ICD-10-CM | POA: Diagnosis not present

## 2015-12-13 DIAGNOSIS — I5023 Acute on chronic systolic (congestive) heart failure: Secondary | ICD-10-CM

## 2015-12-13 DIAGNOSIS — E876 Hypokalemia: Secondary | ICD-10-CM

## 2015-12-13 DIAGNOSIS — I340 Nonrheumatic mitral (valve) insufficiency: Secondary | ICD-10-CM

## 2015-12-13 LAB — COMPLETE METABOLIC PANEL WITH GFR
ALBUMIN: 4.3 g/dL (ref 3.6–5.1)
ALK PHOS: 75 U/L (ref 40–115)
ALT: 17 U/L (ref 9–46)
AST: 20 U/L (ref 10–35)
BILIRUBIN TOTAL: 0.5 mg/dL (ref 0.2–1.2)
BUN: 47 mg/dL — ABNORMAL HIGH (ref 7–25)
CALCIUM: 9.9 mg/dL (ref 8.6–10.3)
CO2: 35 mmol/L — ABNORMAL HIGH (ref 20–31)
Chloride: 88 mmol/L — ABNORMAL LOW (ref 98–110)
Creat: 1.51 mg/dL — ABNORMAL HIGH (ref 0.70–1.25)
GFR, EST AFRICAN AMERICAN: 57 mL/min — AB (ref >=60)
GFR, EST NON AFRICAN AMERICAN: 49 mL/min — AB (ref >=60)
Glucose, Bld: 233 mg/dL — ABNORMAL HIGH (ref 65–99)
Potassium: 2.8 mmol/L — ABNORMAL LOW (ref 3.5–5.3)
Sodium: 136 mmol/L (ref 135–146)
TOTAL PROTEIN: 6.4 g/dL (ref 6.1–8.1)

## 2015-12-13 LAB — LIPID PANEL
Cholesterol: 209 mg/dL — ABNORMAL HIGH (ref <200)
HDL: 29 mg/dL — AB (ref >40)
LDL Cholesterol: 113 mg/dL — ABNORMAL HIGH
TRIGLYCERIDES: 335 mg/dL — AB (ref <150)
Total CHOL/HDL Ratio: 7.2 Ratio — ABNORMAL HIGH (ref <5.0)
VLDL: 67 mg/dL — ABNORMAL HIGH (ref <30)

## 2015-12-13 LAB — CBC
HCT: 43.9 % (ref 38.5–50.0)
HEMOGLOBIN: 15.3 g/dL (ref 13.2–17.1)
MCH: 30.8 pg (ref 27.0–33.0)
MCHC: 34.9 g/dL (ref 32.0–36.0)
MCV: 88.5 fL (ref 80.0–100.0)
MPV: 11 fL (ref 7.5–12.5)
Platelets: 218 10*3/uL (ref 140–400)
RBC: 4.96 MIL/uL (ref 4.20–5.80)
RDW: 12.4 % (ref 11.0–15.0)
WBC: 8.1 10*3/uL (ref 3.8–10.8)

## 2015-12-13 MED ORDER — ZOSTER VACCINE LIVE 19400 UNT/0.65ML ~~LOC~~ SUSR: 0.6500 mL | Freq: Once | SUBCUTANEOUS | 0 refills | Status: AC

## 2015-12-13 NOTE — Progress Notes (Signed)
Peter Griffin is a 60 y.o. male who [name] today for follow-up potassium, and diabetes, and heart failure.  As noted previously patient has heart failure and takes multiple diuretics to control edema. This resulted in chronically low potassium. About a month ago we started spironolactone. So far with measurements the potassium levels have always been normal. He feels well with no further leg swelling chest pain or palpitations shortness of breath.  Diabetes. Previously  well-controlled. Patient denies any polyuria or polydipsia.  Lab Results  Component Value Date   HGBA1C 7.3 (H) 10/04/2015  Patient tolerates the medications listed below with no issues.  Hyperlipidemia: Patient currently takes Lipitor area and this been sometime since his lipid panel was checked. He denies significant muscle aches or pain. He tolerates medication well.    Past Medical History:  Diagnosis Date  . Acute on chronic systolic heart failure (HCC) 05/29/2015  . Diabetes mellitus without complication (HCC)   . Hyperlipidemia 01/21/2014   Lipitor started Dec 2015   . Hypertension   . Ischemic cardiomyopathy 10/21/2014   EF 30-35% August 2016, no improvement with medical therapy.  Will be seeing Dr.Preli for possible ICD implantation January 2017    Past Surgical History:  Procedure Laterality Date  . CARDIAC SURGERY    . DENTAL SURGERY    . QUADRICEPS TENDON REPAIR Left 03/16/2012   Procedure: REPAIR QUADRICEP TENDON;  Surgeon: Eldred MangesMark C Yates, MD;  [facility];  Service: Orthopedics;  Laterality: Left;  . TONSILLECTOMY     Social History  Substance Use Topics  . Smoking status: Never Smoker  . Smokeless tobacco: Never Used  . Alcohol use No   family history is not on file.  ROS as above:  Medications: Current Outpatient Prescriptions  Medication Sig  Dispense Refill  . AMBULATORY NON FORMULARY MEDICATION Knee high compression stockings to be worn during all waking hours. Dx: Congestive heart failure. 2 Units 11  . aspirin 81 MG tablet Take 81 mg by mouth daily.    Marland Kitchen. atorvastatin (LIPITOR) 80 MG tablet Take 1 tablet (80 mg total) by mouth daily.    . bumetanide (BUMEX) 2 MG tablet Take 1 tablet (2 mg total) by mouth 2 (two) times daily. 60 tablet 2  . KRILL OIL PO Take 350 mg by mouth.    . losartan (COZAAR) 100 MG tablet Take 1 tablet (100 mg total) by mouth daily. 90 tablet 3  . Lutein 6 MG CAPS Take by mouth.    . metFORMIN (GLUCOPHAGE) 1000 MG tablet Take 1 tablet (1,000 mg total) by mouth 2 (two) times daily. 180 tablet 1  . metolazone (ZAROXOLYN) 5 MG tablet TAKE 1 TABLET BY MOUTH EVERY MORNING REPLACING AM BUMEX DOSE IF NEEDED FOR ANKLE SWELLING 30 tablet 2  . metoprolol succinate (TOPROL-XL) 50 MG 24 hr tablet Take 2 tablets (100 mg total) by mouth daily. Take with or immediately following a meal. 90 tablet 3  . multivitamin-lutein (OCUVITE-LUTEIN) CAPS Take 1 capsule by mouth daily.    . pantoprazole (PROTONIX) 40 MG tablet Take 1 tablet (40 mg total) by mouth daily. 30 tablet 0  . potassium chloride SA (K-DUR,KLOR-CON) 20 MEQ tablet Take 1 tablet (20 mEq total) by mouth 3 (three) times daily. 90 tablet 1  . sitaGLIPtin (JANUVIA) 100 MG tablet Take 1 tablet (100 mg total) by mouth daily. 90 tablet 1  . spironolactone (ALDACTONE) 25 MG tablet  TAKE 1 TABLET (25 MG TOTAL) BY MOUTH DAILY. 30 tablet 0  . Zoster Vaccine Live, PF, (ZOSTAVAX) [id number] UNT/0.65ML injection Inject 19,400 Units into the skin once. If given in pharmacy fax report to Dr Denyse Amassorey [phone] 1 each 0   No current facility-administered medications for this visit.    Allergies  Allergen Reactions  . Lisinopril     SOB    Health Maintenance Health Maintenance  Topic Date Due  . OPHTHALMOLOGY EXAM  [birth year]/08/1964  . HIV Screening  12/08/1969  . COLONOSCOPY   12/08/2004  . ZOSTAVAX  12/09/2014  . HEMOGLOBIN A1C  04/03/2016  . FOOT EXAM  10/31/2016  . PNEUMOCOCCAL POLYSACCHARIDE VACCINE (2) 10/31/2020  . TETANUS/TDAP  10/31/2025  . INFLUENZA VACCINE  Completed  . Hepatitis C Screening  Completed     Exam:  BP 96/68   Pulse 93   Wt 160 lb (72.6 kg)   SpO2 100%   BMI 22.96 kg/m  Gen: Well NAD HEENT: EOMI,  MMM Lungs: Normal work of breathing. CTABL Heart: RRR no MRG Abd: NABS, Soft. Nondistended, Nontender Exts: Brisk capillary refill, warm and well perfused. No edema   No results found for this or any previous visit (from the past 72 hour(s)). No results found.    Assessment and Plan: 60 y.o. male with  Heart failure and potassium: Check metabolic panel today. Continue spironolactone.  Diabetes: Doing well. A1c at goal. Continue current regimen. Recheck in 3 months. We'll obtain medical records for diabetic eye exam. Recheck in 3 months.  Lipidemia: Recheck lipid panel. Continue Lipitor.  Health Maintenance: Shingles vaccine sent to pharmacy. Patient declined colon cancer screening and HIV screening.   Orders Placed This Encounter  Procedures  . CBC  . COMPLETE METABOLIC PANEL WITH GFR  . Lipid panel    Discussed warning signs or symptoms. Please see discharge instructions. Patient expresses understanding.

## 2015-12-13 NOTE — Patient Instructions (Addendum)
Thank you for coming in today. Get labs.  Recheck in 3 months.  Return sooner if needed.   You should be able to pick up the [facility]les [facility] from [facility] [place]

## 2015-12-14 ENCOUNTER — Other Ambulatory Visit: Payer: Self-pay

## 2015-12-14 MED ORDER — SPIRONOLACTONE 25 MG PO TABS: 25.0000 mg | ORAL_TABLET | Freq: Every day | ORAL | 1 refills | Status: DC

## 2015-12-14 MED ORDER — SITAGLIPTIN PHOSPHATE 100 MG PO TABS: 100.0000 mg | ORAL_TABLET | Freq: Every day | ORAL | 1 refills | Status: DC

## 2015-12-14 NOTE — Addendum Note (Signed)
Addended by: Rodolph BongOREY, Eldwin Volkov S on: 12/14/2015 07:23 AM   Modules accepted: Orders

## 2015-12-22 ENCOUNTER — Telehealth: Payer: Self-pay | Admitting: *Deleted

## 2015-12-22 NOTE — Telephone Encounter (Signed)
PA initiated through covermymeds.Key: [id number] - Rx #: [id number]

## 2015-12-26 NOTE — Telephone Encounter (Signed)
Alma FriendlyJanuvia has been denied by insurance. Insurance wants patient to have tried Onglyza

## 2015-12-27 MED ORDER — SAXAGLIPTIN HCL 5 MG PO TABS: 5.0000 mg | ORAL_TABLET | Freq: Every day | ORAL | 0 refills | Status: DC

## 2015-12-27 NOTE — Telephone Encounter (Signed)
Pt.notified

## 2015-12-27 NOTE — Telephone Encounter (Signed)
Will switch to Onglyza

## 2016-01-12 ENCOUNTER — Ambulatory Visit (INDEPENDENT_AMBULATORY_CARE_PROVIDER_SITE_OTHER): Payer: BLUE CROSS/BLUE SHIELD | Admitting: Family Medicine

## 2016-01-12 ENCOUNTER — Encounter: Payer: Self-pay | Admitting: Family Medicine

## 2016-01-12 VITALS — BP 98/73 | HR 82 | Ht 70.0 in | Wt 163.0 lb

## 2016-01-12 DIAGNOSIS — E119 Type 2 diabetes mellitus without complications: Secondary | ICD-10-CM

## 2016-01-12 LAB — POCT GLYCOSYLATED HEMOGLOBIN (HGB A1C): Hemoglobin A1C: 9.1

## 2016-01-12 MED ORDER — LOSARTAN POTASSIUM 25 MG PO TABS: 25.0000 mg | ORAL_TABLET | Freq: Every day | ORAL | 0 refills | Status: DC

## 2016-01-12 MED ORDER — METOPROLOL SUCCINATE ER 25 MG PO TB24: 25.0000 mg | ORAL_TABLET | Freq: Every day | ORAL | 0 refills | Status: DC

## 2016-01-12 NOTE — Patient Instructions (Signed)
Thank you for coming in today. START taking the metformin twice daily.  Recheck in 3 months.  Return sooner if needed.

## 2016-01-12 NOTE — Progress Notes (Signed)
Note sent to Dr. Lottie DawsonPreli via epic.

## 2016-01-12 NOTE — Progress Notes (Signed)
Peter Griffin is a 60 y.o. male who [name] today for follow-up hypertension and heart failure hyperlipidemia and diabetes.  Patient has been seen several times for heart failure as relates to his hypertension and potassium levels. He was started on spironolactone. He seemed to have done quite well and feels fine. He notes his blood pressure has been running a bit low so he stopped taking his losartan recently. Currently he takes metoprolol extended release 50 mg daily, as well as Bumex daily and Zaroxolyn daily. Additionally he takes potassium supplementation.   As for his hyperlipidemia his atorvastatin dose was reduced from 80 mg to 40 mg daily by his cardiologist last month. He has a metabolic panel scheduled in about 8 weeks for recheck.  Diabetes: Patient takes metformin 1000 mg once daily as well as Onglyza 5 mg daily. He denies significant polyuria or polydipsia.   Past Medical History:  Diagnosis Date  . Acute on chronic systolic heart failure (HCC) 05/29/2015  . Diabetes mellitus without complication (HCC)   . Hyperlipidemia 01/21/2014   Lipitor started Dec 2015   . Hypertension   . Ischemic cardiomyopathy 10/21/2014   EF 30-35% August 2016, no improvement with medical therapy.  Will be seeing Dr.Preli for possible ICD implantation January 2017    Past Surgical History:  Procedure Laterality Date  . CARDIAC SURGERY    . DENTAL SURGERY    . QUADRICEPS TENDON REPAIR Left 03/16/2012   Procedure: REPAIR QUADRICEP TENDON;  Surgeon: Eldred MangesMark C Yates, MD;  [facility];  Service: Orthopedics;  Laterality: Left;  . TONSILLECTOMY     Social History  Substance Use Topics  . Smoking status: Never Smoker  . Smokeless tobacco: Never Used  . Alcohol use No   family history is not on file.  ROS as above:  Medications: Current Outpatient  Prescriptions  Medication Sig Dispense Refill  . AMBULATORY NON FORMULARY MEDICATION Knee high compression stockings to be worn during all waking hours. Dx: Congestive heart failure. 2 Units 11  . aspirin 81 MG tablet Take 81 mg by mouth daily.    Marland Kitchen. atorvastatin (LIPITOR) 40 MG tablet Take by mouth.    Marland Kitchen. KRILL OIL PO Take 350 mg by mouth.    . Lutein 6 MG CAPS Take by mouth.    . metFORMIN (GLUCOPHAGE) 1000 MG tablet Take 1 tablet (1,000 mg total) by mouth 2 (two) times daily. 180 tablet 1  . metolazone (ZAROXOLYN) 5 MG tablet TAKE 1 TABLET BY MOUTH EVERY MORNING REPLACING AM BUMEX DOSE IF NEEDED FOR ANKLE SWELLING 30 tablet 2  . multivitamin-lutein (OCUVITE-LUTEIN) CAPS Take 1 capsule by mouth daily.    . potassium chloride SA (K-DUR,KLOR-CON) 20 MEQ tablet Take 1 tablet (20 mEq total) by mouth 3 (three) times daily. 90 tablet 1  . saxagliptin HCl (ONGLYZA) 5 MG TABS tablet Take 1 tablet (5 mg total) by mouth daily. 90 tablet 0  . spironolactone (ALDACTONE) 25 MG tablet Take 1 tablet (25 mg total) by mouth daily. 90 tablet 1  . bumetanide (BUMEX) 2 MG tablet Take by mouth.    . losartan (COZAAR) 25 MG tablet Take 1 tablet (25 mg total) by mouth daily. 90 tablet 0  . metoprolol succinate (TOPROL-XL) 25 MG 24 hr tablet Take 1 tablet (25 mg total) by mouth daily. 90 tablet 0   No current facility-administered medications for this visit.    Allergies  Allergen Reactions  . Lisinopril     SOB    Health Maintenance Health Maintenance  Topic Date Due  . OPHTHALMOLOGY EXAM  12/08/1965  . ZOSTAVAX  12/09/2014  . COLONOSCOPY  02/03/2029 (Originally 12/08/2004)  . HIV Screening  02/03/2029 (Originally 12/08/1969)  . HEMOGLOBIN A1C  04/03/2016  . FOOT EXAM  10/31/2016  . PNEUMOCOCCAL POLYSACCHARIDE VACCINE (2) 10/31/2020  . TETANUS/TDAP  10/31/2025  . INFLUENZA VACCINE  Completed  . Hepatitis C Screening  Completed     Exam:  BP 98/73   Pulse 82   Ht 5\' 10"  (1.778 m)   Wt 163 lb (73.9  kg)   BMI 23.39 kg/m  Gen: Well NAD HEENT: EOMI,  MMM Lungs: Normal work of breathing. CTABL Heart: RRR no MRG Abd: NABS, Soft. Nondistended, Nontender Exts: Brisk capillary refill, warm and well perfused. No significant edema   Results for orders placed or performed in visit on 01/12/16 (from the past 72 hour(s))  POCT HgB A1C     Status: None   Collection Time: 01/12/16  8:44 AM  Result Value Ref Range   Hemoglobin A1C 9.1    No results found.    Assessment and Plan: 60 y.o. male with  Heart failure/hypertension:  Patient was not taking his angiotension receptor blocker due to low blood pressures. I think is probably a good idea to be on an ARB as well as a beta blocker with heart failure. Plan to use both lower doses of metoprolol as well as losartan. We'll use 25 mg of each daily and keep a home blood pressure log. Patient will have lab follow-up in a few weeks with his cardiologist will follow from there.  Diabetes: A1c Not well controlled today. We'll increase metformin to twice daily and recheck in 3 months.  Hypokalemia: Persistent issue. Seems to be doing pretty well recently. We'll recheck labs with cardiologist in the near future.  Health Maintaince:  Will get records from [facility] [place] Will delay Zoster vaccine in anticipation of future administration for new Shingles vaccine  Orders Placed This Encounter  Procedures  . POCT HgB A1C    Discussed warning signs or symptoms. Please see discharge instructions. Patient expresses understanding.  CC: Cardiology Julaine HuaPreli, Robert B, MD  [address]  [place], [place] [id number]  [phone]  [phone] (Fax)

## 2016-01-15 ENCOUNTER — Encounter: Payer: Self-pay | Admitting: Family Medicine

## 2016-01-15 DIAGNOSIS — H353190 Nonexudative age-related macular degeneration, unspecified eye, stage unspecified: Secondary | ICD-10-CM | POA: Insufficient documentation

## 2016-01-18 ENCOUNTER — Other Ambulatory Visit: Payer: Self-pay | Admitting: Family Medicine

## 2016-01-18 DIAGNOSIS — E876 Hypokalemia: Secondary | ICD-10-CM

## 2016-01-18 DIAGNOSIS — I10 Essential (primary) hypertension: Secondary | ICD-10-CM

## 2016-01-22 ENCOUNTER — Telehealth: Payer: Self-pay | Admitting: Family Medicine

## 2016-01-22 MED ORDER — AMBULATORY NON FORMULARY MEDICATION: 12 refills | Status: AC

## 2016-01-22 NOTE — Telephone Encounter (Signed)
Pt came by to ask if he could get a prescription for his blood monitoring strips. He stated that they would be cheaper for him with a prescription. He is unsure if he needs a certain kind of strip. Pt stated to call him if there were any questions or concerns. Please advise.  Thanks!

## 2016-01-22 NOTE — Telephone Encounter (Signed)
Prescription for blood sugar test strips provided. Patient concerning by to pick them up any time.

## 2016-01-30 ENCOUNTER — Encounter: Payer: Self-pay | Admitting: Family Medicine

## 2016-02-27 ENCOUNTER — Encounter: Payer: Self-pay | Admitting: Family Medicine

## 2016-02-27 ENCOUNTER — Ambulatory Visit (INDEPENDENT_AMBULATORY_CARE_PROVIDER_SITE_OTHER): Payer: BLUE CROSS/BLUE SHIELD | Admitting: Family Medicine

## 2016-02-27 VITALS — BP 109/74 | HR 116 | Temp 97.7°F | Wt 160.0 lb

## 2016-02-27 DIAGNOSIS — E1165 Type 2 diabetes mellitus with hyperglycemia: Secondary | ICD-10-CM | POA: Diagnosis not present

## 2016-02-27 DIAGNOSIS — R000 Tachycardia, unspecified: Secondary | ICD-10-CM

## 2016-02-27 DIAGNOSIS — E1139 Type 2 diabetes mellitus with other diabetic ophthalmic complication: Secondary | ICD-10-CM | POA: Diagnosis not present

## 2016-02-27 DIAGNOSIS — R05 Cough: Secondary | ICD-10-CM

## 2016-02-27 DIAGNOSIS — R059 Cough, unspecified: Secondary | ICD-10-CM

## 2016-02-27 DIAGNOSIS — IMO0002 Reserved for concepts with insufficient information to code with codable children: Secondary | ICD-10-CM

## 2016-02-27 LAB — CBC WITH DIFFERENTIAL/PLATELET
BASOS ABS: 0 {cells}/uL (ref 0–200)
Basophils Relative: 0 %
Eosinophils Absolute: 0 cells/uL — ABNORMAL LOW (ref 15–500)
Eosinophils Relative: 0 %
HCT: 38.2 % — ABNORMAL LOW (ref 38.5–50.0)
HEMOGLOBIN: 13 g/dL — AB (ref 13.2–17.1)
Lymphocytes Relative: 4 %
Lymphs Abs: 572 cells/uL — ABNORMAL LOW (ref 850–3900)
MCH: 30.1 pg (ref 27.0–33.0)
MCHC: 34 g/dL (ref 32.0–36.0)
MCV: 88.4 fL (ref 80.0–100.0)
MPV: 12 fL (ref 7.5–12.5)
Monocytes Absolute: 1001 cells/uL — ABNORMAL HIGH (ref 200–950)
Monocytes Relative: 7 %
NEUTROS ABS: 12727 {cells}/uL — AB (ref 1500–7800)
NEUTROS PCT: 89 %
Platelets: 258 10*3/uL (ref 140–400)
RBC: 4.32 MIL/uL (ref 4.20–5.80)
RDW: 12.7 % (ref 11.0–15.0)
WBC: 14.3 10*3/uL — ABNORMAL HIGH (ref 3.8–10.8)

## 2016-02-27 LAB — COMPLETE METABOLIC PANEL WITH GFR
ALK PHOS: 73 U/L (ref 40–115)
ALT: 17 U/L (ref 9–46)
AST: 17 U/L (ref 10–35)
Albumin: 3.7 g/dL (ref 3.6–5.1)
BILIRUBIN TOTAL: 0.9 mg/dL (ref 0.2–1.2)
BUN: 60 mg/dL — ABNORMAL HIGH (ref 7–25)
CALCIUM: 8.7 mg/dL (ref 8.6–10.3)
CO2: 32 mmol/L — ABNORMAL HIGH (ref 20–31)
Chloride: 80 mmol/L — ABNORMAL LOW (ref 98–110)
Creat: 1.55 mg/dL — ABNORMAL HIGH (ref 0.70–1.25)
GFR, EST AFRICAN AMERICAN: 55 mL/min — AB (ref >=60)
GFR, EST NON AFRICAN AMERICAN: 48 mL/min — AB (ref >=60)
Glucose, Bld: 419 mg/dL — ABNORMAL HIGH (ref 65–99)
Potassium: 2.9 mmol/L — ABNORMAL LOW (ref 3.5–5.3)
Sodium: 128 mmol/L — ABNORMAL LOW (ref 135–146)
TOTAL PROTEIN: 6.3 g/dL (ref 6.1–8.1)

## 2016-02-27 MED ORDER — GUAIFENESIN-CODEINE 100-10 MG/5ML PO SOLN: 5.0000 mL | Freq: Every evening | ORAL | 0 refills | Status: DC | PRN

## 2016-02-27 MED ORDER — IPRATROPIUM BROMIDE 0.06 % NA SOLN: 2.0000 | NASAL | 6 refills | Status: AC | PRN

## 2016-02-27 MED ORDER — DAPAGLIFLOZIN PROPANEDIOL 5 MG PO TABS: 5.0000 mg | ORAL_TABLET | Freq: Every day | ORAL | 1 refills | Status: DC

## 2016-02-27 NOTE — Progress Notes (Signed)
Peter Griffin is a 61 y.o. male who presents to [name]: [facility] today for cough. Patient has a several day history of coughing congestion. He denies fevers chills chest pain or palpitations. He does feel a bit fatigued. He also notes the cough is associated with nasal discharge. He notes the cough is interfering with sleep. He denies any worsening leg swelling. Denies lightheadedness or dizziness.  He has his blood sugars have been elevated recently up to the 500s. This is much higher than usual. His typical blood sugars were not sick are in the 200s. He takes metformin and Onglyza for diabetes control. He would like to avoid insulin if possible.    Past Medical History:  Diagnosis Date  . Acute on chronic systolic heart failure (HCC) 05/29/2015  . Diabetes mellitus without complication (HCC)   . Hyperlipidemia 01/21/2014   Lipitor started Dec 2015   . Hypertension   . Ischemic cardiomyopathy 10/21/2014   EF 30-35% August 2016, no improvement with medical therapy.  Will be seeing Dr.Preli for possible ICD implantation January 2017    Past Surgical History:  Procedure Laterality Date  . CARDIAC SURGERY    . DENTAL SURGERY    . QUADRICEPS TENDON REPAIR Left 03/16/2012   Procedure: REPAIR QUADRICEP TENDON;  Surgeon: Eldred Manges, MD;  Location: [facility] OR;  Service: Orthopedics;  Laterality: Left;  . TONSILLECTOMY     Social History  Substance Use Topics  . Smoking status: Never Smoker  . Smokeless tobacco: Never Used  . Alcohol use No   family history is not on file.  ROS as above:  Medications: Current Outpatient Prescriptions  Medication Sig Dispense Refill  . AMBULATORY NON FORMULARY MEDICATION Knee high compression stockings to be worn during all waking hours. Dx: Congestive heart failure. 2 Units 11  . AMBULATORY NON FORMULARY MEDICATION lancets, test strips for  3x daily testing.  Disp QS x1 month E11.39 1 each 12  . aspirin 81 MG tablet Take 81 mg by mouth daily.    Marland Kitchen atorvastatin (LIPITOR) 40 MG tablet Take by mouth.    . bumetanide (BUMEX) 2 MG tablet Take by mouth.    . dapagliflozin propanediol (FARXIGA) 5 MG TABS tablet Take 5 mg by mouth daily. 30 tablet 1  . guaiFENesin-codeine 100-10 MG/5ML syrup Take 5 mLs by mouth at bedtime as needed for cough. 120 mL 0  . ipratropium (ATROVENT) 0.06 % nasal spray Place 2 sprays into both nostrils every 4 (four) hours as needed for rhinitis. 10 mL 6  . KRILL OIL PO Take 350 mg by mouth.    . losartan (COZAAR) 25 MG tablet Take 1 tablet (25 mg total) by mouth daily. 90 tablet 0  . Lutein 6 MG CAPS Take by mouth.    . metFORMIN (GLUCOPHAGE) 1000 MG tablet Take 1 tablet (1,000 mg total) by mouth 2 (two) times daily. 180 tablet 1  . metolazone (ZAROXOLYN) 5 MG tablet TAKE 1 TABLET BY MOUTH EVERY MORNING REPLACING AM BUMEX DOSE IF NEEDED FOR ANKLE SWELLING 30 tablet 2  . metoprolol succinate (TOPROL-XL) 25 MG 24 hr tablet Take 1 tablet (25 mg total) by mouth daily. 90 tablet 0  . multivitamin-lutein (OCUVITE-LUTEIN) CAPS Take 1 capsule by mouth daily.    . potassium chloride SA (K-DUR,KLOR-CON) 20 MEQ tablet Take 1 tablet (20 mEq total) by mouth 3 (three) times daily. 90 tablet 1  . saxagliptin HCl (ONGLYZA) 5 MG  TABS tablet Take 1 tablet (5 mg total) by mouth daily. 90 tablet 0  . spironolactone (ALDACTONE) 25 MG tablet Take 1 tablet (25 mg total) by mouth daily. 90 tablet 1   No current facility-administered medications for this visit.    Allergies  Allergen Reactions  . Lisinopril     SOB    Health Maintenance Health Maintenance  Topic Date Due  . ZOSTAVAX  10/05/2016 (Originally 12/09/2014)  . COLONOSCOPY  02/03/2029 (Originally 12/08/2004)  . HIV Screening  02/03/2029 (Originally 12/08/1969)  . OPHTHALMOLOGY EXAM  04/27/2016  . HEMOGLOBIN A1C  07/12/2016  . FOOT EXAM  10/31/2016  . PNEUMOCOCCAL  POLYSACCHARIDE VACCINE (2) 10/31/2020  . TETANUS/TDAP  10/31/2025  . INFLUENZA VACCINE  Completed  . Hepatitis C Screening  Completed     Exam:  BP 109/74   Pulse (!) 116   Temp 97.7 F (36.5 C) (Oral)   Wt 160 lb (72.6 kg)   SpO2 95%   BMI 22.96 kg/m  Gen: Well NAD HEENT: EOMI,  MMM Clear Nasal discharge. Lungs: Normal work of breathing. CTABL Heart: Tachycardia present no MRG Abd: NABS, Soft. Nondistended, Nontender Exts: Brisk capillary refill, warm and well perfused. No edema  Lab Results  Component Value Date   HGBA1C 9.1 01/12/2016    No results found for this or any previous visit (from the past 72 hour(s)). No results found.    Assessment and Plan: 61 y.o. male with  Viral illness likely simple URI. Patient is chronically ill and therefore a bit higher risk. We'll obtain chest x-ray and CBC to evaluate for early pneumonia as patient is very vulnerable.  Additionally patient is tachycardic. I think this is related to his elevated blood sugars which probably relates back to chronically poorly controlled diabetes exacerbated by recent illness. Regardless of his overall current illness his blood sugars are not well-controlled. We'll start [place] and recheck in 2-3 days. If not significantly improved or if worsening will be more aggressive.   Orders Placed This Encounter  Procedures  . DG Chest 2 View    Order Specific Question:   Reason for exam:    Answer:   Cough, assess intra-thoracic pathology    Order Specific Question:   Preferred imaging location?    Answer[facility]   [facility]  . CBC with Differential/Platelet  . COMPLETE METABOLIC PANEL WITH GFR    Discussed warning signs or symptoms. Please see discharge instructions. Patient expresses understanding.

## 2016-02-27 NOTE — Patient Instructions (Signed)
Thank you for coming in today. Call or go to the emergency room if you get worse, have trouble breathing, have chest pains, or palpitations.  Recheck in 2-3 days  Get labs and xray today.  Add Farxiga to you diabetes medicine.    Cough, Adult Introduction A cough helps to clear your throat and lungs. A cough may last only 2-3 weeks (acute), or it may last longer than 8 weeks (chronic). Many different things can cause a cough. A cough may be a sign of an illness or another medical condition. Follow these instructions at home:  Pay attention to any changes in your cough.  Take medicines only as told by your doctor.  If you were prescribed an antibiotic medicine, take it as told by your doctor. Do not stop taking it even if you start to feel better.  Talk with your doctor before you try using a cough medicine.  Drink enough fluid to keep your pee (urine) clear or pale yellow.  If the air is dry, use a cold steam vaporizer or humidifier in your home.  Stay away from things that make you cough at work or at home.  If your cough is worse at night, try using extra pillows to raise your head up higher while you sleep.  Do not smoke, and try not to be around smoke. If you need help quitting, ask your doctor.  Do not have caffeine.  Do not drink alcohol.  Rest as needed. Contact a doctor if:  You have new problems (symptoms).  You cough up yellow fluid (pus).  Your cough does not get better after 2-3 weeks, or your cough gets worse.  Medicine does not help your cough and you are not sleeping well.  You have pain that gets worse or pain that is not helped with medicine.  You have a fever.  You are losing weight and you do not know why.  You have night sweats. Get help right away if:  You cough up blood.  You have trouble breathing.  Your heartbeat is very fast. This information is not intended to replace advice given to you by your health care provider. Make sure you  discuss any questions you have with your health care provider. Document Released: 10/04/2010 Document Revised: 06/29/2015 Document Reviewed: 03/30/2014  2017 Elsevier

## 2016-02-28 ENCOUNTER — Ambulatory Visit (INDEPENDENT_AMBULATORY_CARE_PROVIDER_SITE_OTHER): Payer: BLUE CROSS/BLUE SHIELD

## 2016-02-28 ENCOUNTER — Telehealth: Payer: Self-pay | Admitting: Family Medicine

## 2016-02-28 DIAGNOSIS — R0602 Shortness of breath: Secondary | ICD-10-CM | POA: Diagnosis not present

## 2016-02-28 DIAGNOSIS — R05 Cough: Secondary | ICD-10-CM

## 2016-02-28 NOTE — Telephone Encounter (Signed)
[facility] called adv that pt's Glucose Alert High 419, Results repeated and verified. Thanks

## 2016-02-29 ENCOUNTER — Encounter: Payer: Self-pay | Admitting: Family Medicine

## 2016-02-29 ENCOUNTER — Ambulatory Visit (INDEPENDENT_AMBULATORY_CARE_PROVIDER_SITE_OTHER): Payer: BLUE CROSS/BLUE SHIELD | Admitting: Family Medicine

## 2016-02-29 ENCOUNTER — Telehealth: Payer: Self-pay | Admitting: *Deleted

## 2016-02-29 VITALS — BP 94/59 | HR 124 | Temp 98.5°F | Wt 156.0 lb

## 2016-02-29 DIAGNOSIS — E1139 Type 2 diabetes mellitus with other diabetic ophthalmic complication: Secondary | ICD-10-CM | POA: Diagnosis not present

## 2016-02-29 DIAGNOSIS — IMO0002 Reserved for concepts with insufficient information to code with codable children: Secondary | ICD-10-CM

## 2016-02-29 DIAGNOSIS — E1165 Type 2 diabetes mellitus with hyperglycemia: Secondary | ICD-10-CM

## 2016-02-29 DIAGNOSIS — R000 Tachycardia, unspecified: Secondary | ICD-10-CM | POA: Diagnosis not present

## 2016-02-29 LAB — CBC WITH DIFFERENTIAL/PLATELET
BASOS ABS: 0 {cells}/uL (ref 0–200)
Basophils Relative: 0 %
EOS ABS: 0 {cells}/uL — AB (ref 15–500)
Eosinophils Relative: 0 %
HCT: 38.4 % — ABNORMAL LOW (ref 38.5–50.0)
Hemoglobin: 13.4 g/dL (ref 13.2–17.1)
LYMPHS PCT: 3 %
Lymphs Abs: 474 cells/uL — ABNORMAL LOW (ref 850–3900)
MCH: 30.3 pg (ref 27.0–33.0)
MCHC: 34.9 g/dL (ref 32.0–36.0)
MCV: 86.9 fL (ref 80.0–100.0)
MONOS PCT: 9 %
MPV: 10.9 fL (ref 7.5–12.5)
Monocytes Absolute: 1422 cells/uL — ABNORMAL HIGH (ref 200–950)
Neutro Abs: 13904 cells/uL — ABNORMAL HIGH (ref 1500–7800)
Neutrophils Relative %: 88 %
PLATELETS: 360 10*3/uL (ref 140–400)
RBC: 4.42 MIL/uL (ref 4.20–5.80)
RDW: 13.1 % (ref 11.0–15.0)
WBC: 15.8 10*3/uL — ABNORMAL HIGH (ref 3.8–10.8)

## 2016-02-29 LAB — BASIC METABOLIC PANEL WITH GFR
BUN: 55 mg/dL — AB (ref 7–25)
CO2: 32 mmol/L — ABNORMAL HIGH (ref 20–31)
CREATININE: 1.71 mg/dL — AB (ref 0.70–1.25)
Calcium: 9.2 mg/dL (ref 8.6–10.3)
Chloride: 80 mmol/L — ABNORMAL LOW (ref 98–110)
GFR, EST AFRICAN AMERICAN: 49 mL/min — AB (ref >=60)
GFR, Est Non African American: 42 mL/min — ABNORMAL LOW (ref >=60)
Glucose, Bld: 382 mg/dL — ABNORMAL HIGH (ref 65–99)
Potassium: 3.1 mmol/L — ABNORMAL LOW (ref 3.5–5.3)
Sodium: 128 mmol/L — ABNORMAL LOW (ref 135–146)

## 2016-02-29 NOTE — Telephone Encounter (Signed)
Called [place] and left information on their vm. They will only call back if there is a problem. They will contact the patient.

## 2016-02-29 NOTE — Patient Instructions (Signed)
Thank you for coming in today. Do not take the Bumetanide and Metolazone and Farxiga (Dapagliflozin) today or tomorrow.  Return tomorrow for recheck.  Bring the insulin pen with you.  Go to the ER if you feel worse.  Call or go to the emergency room if you get worse, have trouble breathing, have chest pains, or palpitations.

## 2016-02-29 NOTE — Progress Notes (Signed)
Peter Griffin is a 61 y.o. male who presents to [place]: [place] today for follow up cough and congestion.  He presented with a several day history of cough, congestion, and fatigue a few days ago. He was noted to be afebrile but hyperglycemic to 419 and tachycardic, thought to be poorly controlled diabetes exacerbated by infection. CXR negative; WBC elevated at 14.3. He declined insulin and was started on dapagliflozin on top of metformin and saxagliptin.   For the past 2 days, he has felt about the same with ongoing cough and congestion. He has also had nausea and decreased appetite. Last blood sugar this morning was 385. He has not taken his metolazone this morning; takes bumetanide at night.   Past Medical History:  Diagnosis Date  . Acute on chronic systolic heart failure ([place]) 05/29/2015  . Diabetes mellitus without complication ([place])   . Hyperlipidemia 01/21/2014   Lipitor started Dec 2015   . Hypertension   . Ischemic cardiomyopathy 10/21/2014   EF 30-35% August 2016, no improvement with medical therapy.  Will be seeing Dr.Preli for possible ICD implantation January 2017    Past Surgical History:  Procedure Laterality Date  . CARDIAC SURGERY    . DENTAL SURGERY    . QUADRICEPS TENDON REPAIR Left 03/16/2012   Procedure: REPAIR QUADRICEP TENDON;  Surgeon: Marybelle Killings, MD;  Location: [facility];  Service: Orthopedics;  Laterality: Left;  . TONSILLECTOMY     Social History  Substance Use Topics  . Smoking status: Never Smoker  . Smokeless tobacco: Never Used  . Alcohol use No   family history is not on file.  ROS as above:  Medications: Current Outpatient Prescriptions  Medication Sig Dispense Refill  . AMBULATORY NON FORMULARY MEDICATION Knee high compression stockings to be worn during all waking hours. Dx: Congestive heart failure. 2 Units 11  . AMBULATORY  NON FORMULARY MEDICATION lancets, test strips for 3x daily testing.  Disp QS x1 month E11.39 1 each 12  . aspirin 81 MG tablet Take 81 mg by mouth daily.    Marland Kitchen atorvastatin (LIPITOR) 40 MG tablet Take by mouth.    . bumetanide (BUMEX) 2 MG tablet Take by mouth.    . dapagliflozin propanediol (FARXIGA) 5 MG TABS tablet Take 5 mg by mouth daily. 30 tablet 1  . guaiFENesin-codeine 100-10 MG/5ML syrup Take 5 mLs by mouth at bedtime as needed for cough. 120 mL 0  . ipratropium (ATROVENT) 0.06 % nasal spray Place 2 sprays into both nostrils every 4 (four) hours as needed for rhinitis. 10 mL 6  . KRILL OIL PO Take 350 mg by mouth.    . losartan (COZAAR) 25 MG tablet Take 1 tablet (25 mg total) by mouth daily. 90 tablet 0  . Lutein 6 MG CAPS Take by mouth.    . metFORMIN (GLUCOPHAGE) 1000 MG tablet Take 1 tablet (1,000 mg total) by mouth 2 (two) times daily. 180 tablet 1  . metolazone (ZAROXOLYN) 5 MG tablet TAKE 1 TABLET BY MOUTH EVERY MORNING REPLACING AM BUMEX DOSE IF NEEDED FOR ANKLE SWELLING 30 tablet 2  . metoprolol succinate (TOPROL-XL) 25 MG 24 hr tablet Take 1 tablet (25 mg total) by mouth daily. 90 tablet 0  . multivitamin-lutein (OCUVITE-LUTEIN) CAPS Take 1 capsule by mouth daily.    . potassium chloride SA (K-DUR,KLOR-CON) 20 MEQ tablet Take 1 tablet (20 mEq total) by mouth 3 (three) times daily. 90 tablet  1  . saxagliptin HCl (ONGLYZA) 5 MG TABS tablet Take 1 tablet (5 mg total) by mouth daily. 90 tablet 0  . spironolactone (ALDACTONE) 25 MG tablet Take 1 tablet (25 mg total) by mouth daily. 90 tablet 1   No current facility-administered medications for this visit.    Allergies  Allergen Reactions  . Lisinopril     SOB    Health Maintenance Health Maintenance  Topic Date Due  . ZOSTAVAX  10/05/2016 (Originally 12/09/2014)  . COLONOSCOPY  02/03/2029 (Originally 12/08/2004)  . HIV Screening  02/03/2029 (Originally [birth year]/08/1969)  . OPHTHALMOLOGY EXAM  04/27/2016  . HEMOGLOBIN A1C   07/12/2016  . FOOT EXAM  10/31/2016  . PNEUMOCOCCAL POLYSACCHARIDE VACCINE (2) 10/31/2020  . TETANUS/TDAP  10/31/2025  . INFLUENZA VACCINE  Completed  . Hepatitis C Screening  Completed     Exam:  BP (!) 94/59   Pulse (!) 124   Temp 98.5 F (36.9 C) (Oral)   Wt 156 lb (70.8 kg)   SpO2 99%   BMI 22.38 kg/m  Gen: Well, alert and oriented, NAD HEENT: EOMI,  MMM Lungs: Normal work of breathing. CTABL Heart: RRR no MRG Abd: NABS, Soft. Nondistended, Nontender Exts: Brisk capillary refill, warm and well perfused.   Results for orders placed or performed in visit on 02/27/16 (from the past 72 hour(s))  CBC with Differential/Platelet     Status: Abnormal   Collection Time: 02/27/16 11:07 AM  Result Value Ref Range   WBC 14.3 (H) 3.8 - 10.8 K/uL   RBC 4.32 4.20 - 5.80 MIL/uL   Hemoglobin 13.0 (L) 13.2 - 17.1 g/dL   HCT 38.2 (L) 38.5 - 50.0 %   MCV 88.4 80.0 - 100.0 fL   MCH 30.1 27.0 - 33.0 pg   MCHC 34.0 32.0 - 36.0 g/dL   RDW 12.7 11.0 - 15.0 %   Platelets 258 140 - 400 K/uL   MPV 12.0 7.5 - 12.5 fL   Neutro Abs 12,727 (H) 1,500 - 7,800 cells/uL   Lymphs Abs 572 (L) 850 - 3,900 cells/uL   Monocytes Absolute 1,001 (H) 200 - 950 cells/uL   Eosinophils Absolute 0 (L) 15 - 500 cells/uL   Basophils Absolute 0 0 - 200 cells/uL   Neutrophils Relative % 89 %   Lymphocytes Relative 4 %   Monocytes Relative 7 %   Eosinophils Relative 0 %   Basophils Relative 0 %   Smear Review Criteria for review not met   COMPLETE METABOLIC PANEL WITH GFR     Status: Abnormal   Collection Time: 02/27/16 11:07 AM  Result Value Ref Range   Sodium 128 (L) 135 - 146 mmol/L   Potassium 2.9 (L) 3.5 - 5.3 mmol/L   Chloride 80 (L) 98 - 110 mmol/L   CO2 32 (H) 20 - 31 mmol/L   Glucose, Bld 419 (H) 65 - 99 mg/dL    Comment: Result repeated and verified.   BUN 60 (H) 7 - 25 mg/dL   Creat 1.55 (H) 0.70 - 1.25 mg/dL    Comment:   For patients > or = 62 years of age: The upper reference limit  for Creatinine is approximately 13% higher for people identified as African-American.      Total Bilirubin 0.9 0.2 - 1.2 mg/dL   Alkaline Phosphatase 73 40 - 115 U/L   AST 17 10 - 35 U/L   ALT 17 9 - 46 U/L   Total Protein 6.3 6.1 - 8.1 g/dL  Albumin 3.7 3.6 - 5.1 g/dL   Calcium 8.7 8.6 - 10.3 mg/dL   GFR, Est African American 55 (L) >=60 mL/min   GFR, Est Non African American 48 (L) >=60 mL/min   Dg Chest 2 View  Result Date: 02/28/2016 CLINICAL DATA:  Cough, congestion shortness of breath for 2 weeks, history hypertension, hyperlipidemia, diabetes mellitus, coronary artery disease post stenting, ischemic cardiomyopathy, acute on chronic systolic heart failure EXAM: CHEST  2 VIEW COMPARISON:  05/17/2015 FINDINGS: Normal heart size, mediastinal contours, and pulmonary vascularity. Coronary arterial stents noted. Lungs clear. No pleural effusion or pneumothorax. Bones unremarkable. IMPRESSION: No acute abnormalities. Improved basilar aeration versus previous study. Electronically Signed   By: Lavonia Dana M.D.   On: 02/28/2016 14:30    Assessment and Plan: 61 y.o. male with heart failure presenting with hypotension and tachycardia, likely due to dehydration in the setting of poorly controlled diabetes and infection of unknown etiology. He is tachycardic and mildly hypotensive. His blood sugars are poorly controlled despite maximal non-insulin medical therapy. Avoid treating empirically with antibiotics out of concern for c.diff. Ideally would benefit from fluids and monitoring in the ED given heart failure, but since patient is stable and well-appearing clinically, plan on holding some diuretics and close follow up. - Insulin (triseba) 5u in office today - Hold bumetanide and metolazone and [place] as below - F/u tomorrow  Orders Placed This Encounter  Procedures  . CBC with Differential/Platelet  . BASIC METABOLIC PANEL WITH GFR    Discussed warning signs or symptoms. Please  see discharge instructions. Patient expresses understanding.

## 2016-03-01 ENCOUNTER — Ambulatory Visit (INDEPENDENT_AMBULATORY_CARE_PROVIDER_SITE_OTHER): Payer: BLUE CROSS/BLUE SHIELD | Admitting: Family Medicine

## 2016-03-01 ENCOUNTER — Encounter: Payer: Self-pay | Admitting: Family Medicine

## 2016-03-01 ENCOUNTER — Ambulatory Visit (INDEPENDENT_AMBULATORY_CARE_PROVIDER_SITE_OTHER): Payer: BLUE CROSS/BLUE SHIELD

## 2016-03-01 VITALS — BP 93/60 | HR 122 | Resp 18 | Wt 157.0 lb

## 2016-03-01 DIAGNOSIS — R05 Cough: Secondary | ICD-10-CM

## 2016-03-01 DIAGNOSIS — E118 Type 2 diabetes mellitus with unspecified complications: Secondary | ICD-10-CM

## 2016-03-01 DIAGNOSIS — R000 Tachycardia, unspecified: Secondary | ICD-10-CM | POA: Diagnosis not present

## 2016-03-01 DIAGNOSIS — R059 Cough, unspecified: Secondary | ICD-10-CM

## 2016-03-01 LAB — GLUCOSE, POCT (MANUAL RESULT ENTRY): POC Glucose: 349 mg/dl — AB (ref 70–99)

## 2016-03-01 MED ORDER — INSULIN DEGLUDEC 100 UNIT/ML ~~LOC~~ SOPN: 10.0000 [IU] | PEN_INJECTOR | Freq: Every day | SUBCUTANEOUS | 0 refills | Status: DC

## 2016-03-01 NOTE — Patient Instructions (Addendum)
Thank you for coming in today. Get xray today. Continue to not take the fluid pills.  Increase the insulin to 10 units daily.  Give tomorrow and Sunday.  Recheck Monday.  Call or go to the emergency room if you get worse, have trouble breathing, have chest pains, or palpitations.     Cough, Adult Introduction A cough helps to clear your throat and lungs. A cough may last only 2-3 weeks (acute), or it may last longer than 8 weeks (chronic). Many different things can cause a cough. A cough may be a sign of an illness or another medical condition. Follow these instructions at home:  Pay attention to any changes in your cough.  Take medicines only as told by your doctor.  If you were prescribed an antibiotic medicine, take it as told by your doctor. Do not stop taking it even if you start to feel better.  Talk with your doctor before you try using a cough medicine.  Drink enough fluid to keep your pee (urine) clear or pale yellow.  If the air is dry, use a cold steam vaporizer or humidifier in your home.  Stay away from things that make you cough at work or at home.  If your cough is worse at night, try using extra pillows to raise your head up higher while you sleep.  Do not smoke, and try not to be around smoke. If you need help quitting, ask your doctor.  Do not have caffeine.  Do not drink alcohol.  Rest as needed. Contact a doctor if:  You have new problems (symptoms).  You cough up yellow fluid (pus).  Your cough does not get better after 2-3 weeks, or your cough gets worse.  Medicine does not help your cough and you are not sleeping well.  You have pain that gets worse or pain that is not helped with medicine.  You have a fever.  You are losing weight and you do not know why.  You have night sweats. Get help right away if:  You cough up blood.  You have trouble breathing.  Your heartbeat is very fast. This information is not intended to replace advice  given to you by your health care provider. Make sure you discuss any questions you have with your health care provider. Document Released: 10/04/2010 Document Revised: 06/29/2015 Document Reviewed: 03/30/2014  2017 Elsevier

## 2016-03-01 NOTE — Progress Notes (Signed)
[facility] is a 61 y.o. male who presents to [facility] today for follow-up tachycardia cough and hyperglycemia.  He has been seen several times this week for cough hyperglycemia and tachycardia with hypotension. Laboratory findings are listed below and are significant for mild increase in white blood cell count. He's had a chest x-ray 2 days ago that was unremarkable. Yesterday he was seen and started on Tresiba insulin. He was given 5 units yesterday. Additionally he was asked to hold his Bumex and Zaroxolyn. He's feeling a lot better today. His appetite has increased. He denies any significant shortness of breath or leg swelling. He denies dizziness or chest pain. He continues to experience a mild nonproductive cough.   Past Medical History:  Diagnosis Date  . Acute on chronic systolic heart failure (HCC) 05/29/2015  . Diabetes mellitus without complication (HCC)   . Hyperlipidemia 01/21/2014   Lipitor started Dec 2015   . Hypertension   . Ischemic cardiomyopathy 10/21/2014   EF 30-35% August 2016, no improvement with medical therapy.  Will be seeing Dr.Preli for possible ICD implantation January 2017    Past Surgical History:  Procedure Laterality Date  . CARDIAC SURGERY    . DENTAL SURGERY    . QUADRICEPS TENDON REPAIR Left 03/16/2012   Procedure: REPAIR QUADRICEP TENDON;  Surgeon: Eldred Manges, MD;  Location: [facility] OR;  Service: Orthopedics;  Laterality: Left;  . TONSILLECTOMY     Social History  Substance Use Topics  . Smoking status: Never Smoker  . Smokeless tobacco: Never Used  . Alcohol use No   family history is not on file.  ROS as above:  Medications: Current Outpatient Prescriptions  Medication Sig Dispense Refill  . AMBULATORY NON FORMULARY MEDICATION Knee high compression stockings to be worn during all waking hours. Dx: Congestive heart  failure. 2 Units 11  . AMBULATORY NON FORMULARY MEDICATION lancets, test strips for 3x daily testing.  Disp QS x1 month E11.39 1 each 12  . aspirin 81 MG tablet Take 81 mg by mouth daily.    Marland Kitchen atorvastatin (LIPITOR) 40 MG tablet Take by mouth.    . bumetanide (BUMEX) 2 MG tablet Take by mouth.    Marland Kitchen guaiFENesin-codeine 100-10 MG/5ML syrup Take 5 mLs by mouth at bedtime as needed for cough. 120 mL 0  . ipratropium (ATROVENT) 0.06 % nasal spray Place 2 sprays into both nostrils every 4 (four) hours as needed for rhinitis. 10 mL 6  . KRILL OIL PO Take 350 mg by mouth.    . losartan (COZAAR) 25 MG tablet Take 1 tablet (25 mg total) by mouth daily. 90 tablet 0  . Lutein 6 MG CAPS Take by mouth.    . metFORMIN (GLUCOPHAGE) 1000 MG tablet Take 1 tablet (1,000 mg total) by mouth 2 (two) times daily. 180 tablet 1  . metolazone (ZAROXOLYN) 5 MG tablet TAKE 1 TABLET BY MOUTH EVERY MORNING REPLACING AM BUMEX DOSE IF NEEDED FOR ANKLE SWELLING 30 tablet 2  . metoprolol succinate (TOPROL-XL) 25 MG 24 hr tablet Take 1 tablet (25 mg total) by mouth daily. 90 tablet 0  . multivitamin-lutein (OCUVITE-LUTEIN) CAPS Take 1 capsule by mouth daily.    . potassium chloride SA (K-DUR,KLOR-CON) 20 MEQ tablet Take 1 tablet (20 mEq total) by mouth 3 (three) times daily. 90 tablet 1  . saxagliptin HCl (ONGLYZA) 5 MG TABS tablet Take 1 tablet (5 mg total) by mouth daily.  90 tablet 0  . spironolactone (ALDACTONE) 25 MG tablet Take 1 tablet (25 mg total) by mouth daily. 90 tablet 1  . dapagliflozin propanediol (FARXIGA) 5 MG TABS tablet Take 5 mg by mouth daily. (Patient not taking: Reported on 03/01/2016) 30 tablet 1  . insulin degludec (TRESIBA FLEXTOUCH) 100 UNIT/ML SOPN FlexTouch Pen Inject 0.1 mLs (10 Units total) into the skin daily at 10 pm. 1 pen 0   No current facility-administered medications for this visit.    Allergies  Allergen Reactions  . Lisinopril     SOB    Health Maintenance Health Maintenance  Topic  Date Due  . ZOSTAVAX  10/05/2016 (Originally 12/09/2014)  . COLONOSCOPY  02/03/2029 (Originally 12/08/2004)  . HIV Screening  02/03/2029 (Originally 12/08/1970)  . OPHTHALMOLOGY EXAM  04/27/2016  . HEMOGLOBIN A1C  07/12/2016  . FOOT EXAM  10/31/2016  . PNEUMOCOCCAL POLYSACCHARIDE VACCINE (2) 10/31/2020  . TETANUS/TDAP  10/31/2025  . INFLUENZA VACCINE  Completed  . Hepatitis C Screening  Completed     Exam:  BP 93/60   Pulse (!) 122   Resp 18   Wt 157 lb (71.2 kg)   SpO2 100%   BMI 22.53 kg/m  Gen: Well NAD Nontoxic appearing HEENT: EOMI,  MMM Lungs: Normal work of breathing. CTABL Heart: Mild tachycardia no MRG Abd: NABS, Soft. Nondistended, Nontender Exts: Brisk capillary refill, warm and well perfused. No leg swelling   Patient was given 10 units of Tresiba insulin subcutaneously today  Lab Results  Component Value Date   WBC 15.8 (H) 02/29/2016   HGB 13.4 02/29/2016   HCT 38.4 (L) 02/29/2016   MCV 86.9 02/29/2016   PLT 360 02/29/2016     Chemistry      Component Value Date/Time   NA 128 (L) 02/29/2016 0847   NA 143 04/21/2014   K 3.1 (L) 02/29/2016 0847   CL 80 (L) 02/29/2016 0847   CO2 32 (H) 02/29/2016 0847   BUN 55 (H) 02/29/2016 0847   BUN 10 04/21/2014   CREATININE 1.71 (H) 02/29/2016 0847   GLU 97 04/21/2014      Component Value Date/Time   CALCIUM 9.2 02/29/2016 0847   ALKPHOS 73 02/27/2016 1107   AST 17 02/27/2016 1107   ALT 17 02/27/2016 1107   BILITOT 0.9 02/27/2016 1107       Results for orders placed or performed in visit on 03/01/16 (from the past 72 hour(s))  POCT Glucose (CBG)     Status: Abnormal   Collection Time: 03/01/16  8:34 AM  Result Value Ref Range   POC Glucose 349 (A) 70 - 99 mg/dl   Dg Chest 2 View  Result Date: 02/28/2016 CLINICAL DATA:  Cough, congestion shortness of breath for 2 weeks, history hypertension, hyperlipidemia, diabetes mellitus, coronary artery disease post stenting, ischemic cardiomyopathy, acute on  chronic systolic heart failure EXAM: CHEST  2 VIEW COMPARISON:  05/17/2015 FINDINGS: Normal heart size, mediastinal contours, and pulmonary vascularity. Coronary arterial stents noted. Lungs clear. No pleural effusion or pneumothorax. Bones unremarkable. IMPRESSION: No acute abnormalities. Improved basilar aeration versus previous study. Electronically Signed   By: Ulyses SouthwardMark  Boles M.D.   On: 02/28/2016 14:30      Assessment and Plan: 61 y.o. male with  Tachycardia and mild hypotension: Obviously concerning. I think this is related to dehydration state due to hyperglycemia. Patient is improving subjectively however and appears to be well. Plan to increase insulin to 10 units daily and continue to hold diuretics. Recheck  Monday.  Cough: Patient continues to have a cough and mildly elevated white blood cell count. His lung exam today is pretty reassuring and he looks well and is afebrile with normal oxygen saturation. I don't have a source for potential infection today and therefore I don't think appear treatment with antibiotics is warranted. We'll repeat chest x-ray to try to come up with an explanation for mild increased white blood cell count.  Hyperglycemia: Improving. Continue to use 10 units of insulin daily follow-up Monday.   Orders Placed This Encounter  Procedures  . DG Chest 2 View    Order Specific Question:   Reason for exam:    Answer:   Cough, WBC 15 tachy. looks ok. Double check no PNA    Order Specific Question:   Preferred imaging location?    Answer:   [facility]  . POCT Glucose (CBG)    Discussed warning signs or symptoms. Please see discharge instructions. Patient expresses understanding.

## 2016-03-04 ENCOUNTER — Encounter: Payer: Self-pay | Admitting: Family Medicine

## 2016-03-04 ENCOUNTER — Ambulatory Visit (INDEPENDENT_AMBULATORY_CARE_PROVIDER_SITE_OTHER): Payer: BLUE CROSS/BLUE SHIELD | Admitting: Family Medicine

## 2016-03-04 VITALS — BP 97/65 | HR 111 | Wt 162.0 lb

## 2016-03-04 DIAGNOSIS — Z794 Long term (current) use of insulin: Secondary | ICD-10-CM | POA: Diagnosis not present

## 2016-03-04 DIAGNOSIS — I10 Essential (primary) hypertension: Secondary | ICD-10-CM | POA: Diagnosis not present

## 2016-03-04 DIAGNOSIS — E1139 Type 2 diabetes mellitus with other diabetic ophthalmic complication: Secondary | ICD-10-CM | POA: Diagnosis not present

## 2016-03-04 DIAGNOSIS — IMO0002 Reserved for concepts with insufficient information to code with codable children: Secondary | ICD-10-CM

## 2016-03-04 DIAGNOSIS — E1165 Type 2 diabetes mellitus with hyperglycemia: Secondary | ICD-10-CM

## 2016-03-04 DIAGNOSIS — K219 Gastro-esophageal reflux disease without esophagitis: Secondary | ICD-10-CM | POA: Insufficient documentation

## 2016-03-04 MED ORDER — PEN NEEDLES 32G X 4 MM MISC: 1.0000 | Freq: Every day | 1 refills | Status: AC

## 2016-03-04 NOTE — Progress Notes (Signed)
[facility] is a 61 y.o. male who presents to [name]: [facility] today for follow-up tachycardia, cough, and hyperglycemia.  He was seen several times last week for cough, hyperglycemia, and tachycardia with hypotension, thought to be dehydration due to hyperglycemia. 3 days ago, his symptoms had improved after 5u of insulin, and repeat CXR was unremarkable, but he remained mildly hypotensive and tachycardic with a mildly elevated WBC. Insulin was increased to 10u/day. We have been holding Bumex and Zaroxolyn. Suggested Marcelline Deist previously, but he never picked it up.  Since the last visit, fasting sugars have decreased to 200s and SBPs increased to 100s-120s. He is eating better but still has no energy. Still has a cough, but atrovent and codeine cough syrup have helped. Had an episode of burning pain across his chest last night while lying down that resolved with GasEx and burping.   Past Medical History:  Diagnosis Date  . Acute on chronic systolic heart failure (HCC) 05/29/2015  . Diabetes mellitus without complication (HCC)   . Hyperlipidemia 01/21/2014   Lipitor started Dec 2015   . Hypertension   . Ischemic cardiomyopathy 10/21/2014   EF 30-35% August 2016, no improvement with medical therapy.  Will be seeing Dr.Preli for possible ICD implantation January 2017    Past Surgical History:  Procedure Laterality Date  . CARDIAC SURGERY    . DENTAL SURGERY    . QUADRICEPS TENDON REPAIR Left 03/16/2012   Procedure: REPAIR QUADRICEP TENDON;  Surgeon: Eldred Manges, MD;  Location: [facility] OR;  Service: Orthopedics;  Laterality: Left;  . TONSILLECTOMY     Social History  Substance Use Topics  . Smoking status: Never Smoker  . Smokeless tobacco: Never Used  . Alcohol use No   family history is not on file.  ROS as above:  Medications: Current Outpatient Prescriptions    Medication Sig Dispense Refill  . AMBULATORY NON FORMULARY MEDICATION Knee high compression stockings to be worn during all waking hours. Dx: Congestive heart failure. 2 Units 11  . AMBULATORY NON FORMULARY MEDICATION lancets, test strips for 3x daily testing.  Disp QS x1 month E11.39 1 each 12  . aspirin 81 MG tablet Take 81 mg by mouth daily.    Marland Kitchen atorvastatin (LIPITOR) 40 MG tablet Take by mouth.    . bumetanide (BUMEX) 2 MG tablet Take by mouth.    . dapagliflozin propanediol (FARXIGA) 5 MG TABS tablet Take 5 mg by mouth daily. 30 tablet 1  . guaiFENesin-codeine 100-10 MG/5ML syrup Take 5 mLs by mouth at bedtime as needed for cough. 120 mL 0  . insulin degludec (TRESIBA FLEXTOUCH) 100 UNIT/ML SOPN FlexTouch Pen Inject 0.1 mLs (10 Units total) into the skin daily at 10 pm. 1 pen 0  . ipratropium (ATROVENT) 0.06 % nasal spray Place 2 sprays into both nostrils every 4 (four) hours as needed for rhinitis. 10 mL 6  . KRILL OIL PO Take 350 mg by mouth.    . losartan (COZAAR) 25 MG tablet Take 1 tablet (25 mg total) by mouth daily. 90 tablet 0  . Lutein 6 MG CAPS Take by mouth.    . metFORMIN (GLUCOPHAGE) 1000 MG tablet Take 1 tablet (1,000 mg total) by mouth 2 (two) times daily. 180 tablet 1  . metolazone (ZAROXOLYN) 5 MG tablet TAKE 1 TABLET BY MOUTH EVERY MORNING REPLACING AM BUMEX DOSE IF NEEDED FOR ANKLE SWELLING 30 tablet 2  . metoprolol succinate (TOPROL-XL)  25 MG 24 hr tablet Take 1 tablet (25 mg total) by mouth daily. 90 tablet 0  . multivitamin-lutein (OCUVITE-LUTEIN) CAPS Take 1 capsule by mouth daily.    . potassium chloride SA (K-DUR,KLOR-CON) 20 MEQ tablet Take 1 tablet (20 mEq total) by mouth 3 (three) times daily. 90 tablet 1  . saxagliptin HCl (ONGLYZA) 5 MG TABS tablet Take 1 tablet (5 mg total) by mouth daily. 90 tablet 0  . spironolactone (ALDACTONE) 25 MG tablet Take 1 tablet (25 mg total) by mouth daily. 90 tablet 1  . Insulin Pen Needle (PEN NEEDLES) 32G X 4 MM MISC 1 each  by Does not apply route daily. 100 each 1   No current facility-administered medications for this visit.    Allergies  Allergen Reactions  . Lisinopril     SOB    Health Maintenance Health Maintenance  Topic Date Due  . ZOSTAVAX  10/05/2016 (Originally 12/09/2014)  . COLONOSCOPY  02/03/2029 (Originally 12/08/2004)  . HIV Screening  02/03/2029 (Originally 12/08/1970)  . OPHTHALMOLOGY EXAM  04/27/2016  . HEMOGLOBIN A1C  07/12/2016  . FOOT EXAM  10/31/2016  . PNEUMOCOCCAL POLYSACCHARIDE VACCINE (2) 10/31/2020  . TETANUS/TDAP  10/31/2025  . INFLUENZA VACCINE  Completed  . Hepatitis C Screening  Completed     Exam:  BP 97/65   Pulse (!) 111   Wt 162 lb (73.5 kg)   SpO2 90%   BMI 23.24 kg/m  Gen: Well NAD HEENT: EOMI,  MMM No increased JVD. Lungs: Normal work of breathing. CTABL Heart: RRR no MRG Abd: NABS, Soft. Nondistended, Nontender Exts: Brisk capillary refill, warm and well perfused. No edema   No results found for this or any previous visit (from the past 72 hour(s)). Dg Chest 2 View  Result Date: 03/01/2016 CLINICAL DATA:  Pt having cough and congestion for a few weeks,nonsmoker EXAM: CHEST  2 VIEW COMPARISON:  02/28/2016 FINDINGS: Cardiac silhouette is normal in size. Stable left coronary artery stent. Normal mediastinal and hilar contours. Mild bronchial wall thickening in the medial lower lobes, more evident on the right, stable consistent with chronic bronchitic change. Lungs are otherwise clear. No pleural effusion.  No pneumothorax. Skeletal structures are unremarkable. IMPRESSION: No active cardiopulmonary disease. Electronically Signed   By: Amie Portlandavid  Ormond M.D.   On: 03/01/2016 10:50      Assessment and Plan: 61 y.o. male with   Tachycardia and mild hypotension: Likely related to dehydration state due to hyperglycemia. Patient clinically improving, BP is at baseline, tachycardia slightly improved from last visit.  - Increase Tresiba insulin to 15 units  daily - Continue to hold Bumex and Zaroxolyn; plan to add back Bumex when weight returns to 165 lbs - Recheck Thursday  Hyperglycemia: Improving with insulin. Fasting BG in mid 200s. - Trisiba insulin 15u as above  Cough: Persistent for a few weeks; somewhat improved from last visit with atrovent nasal spray and codeine cough syrup. Persistently afebrile with normal O2 sats and reassuring lung exam and CXR, suggesting against pneumonia. Most likely viral etiology. - Continue atrovent & codeine cough syrup - Recheck WBC today  Reflux: Isolated episode of diffuse chest burning last night that improved with GasEx. Doubtful for CAD or ACS. He is well today and at baseline and improved with OTC meds last night. Plan for watchful waiting.  - Recommended Zantac/Pepcid or Prilosec  Elevated Cr: 1.55 ->1.71 over 2 days last week. We have since held Bumex and Zaroxolyn. Recheck today.    Orders Placed  This Encounter  Procedures  . CBC with Differential/Platelet  . BASIC METABOLIC PANEL WITH GFR    Discussed warning signs or symptoms. Please see discharge instructions. Patient expresses understanding.

## 2016-03-04 NOTE — Patient Instructions (Signed)
Thank you for coming in today. Increase insulin to 15 units daily.  Get labs soon or today.  Recheck Thursday.  Return sooner if needed.  Call or go to the emergency room if you get worse, have trouble breathing, have chest pains, or palpitations.  Restart diuretics if weight gets to 165  Call or go to the emergency room if you get worse, have trouble breathing, have chest pains, or palpitations.

## 2016-03-05 LAB — CBC WITH DIFFERENTIAL/PLATELET
BASOS ABS: 0 {cells}/uL (ref 0–200)
Basophils Relative: 0 %
EOS ABS: 0 {cells}/uL — AB (ref 15–500)
EOS PCT: 0 %
HCT: 34.1 % — ABNORMAL LOW (ref 38.5–50.0)
Hemoglobin: 11.4 g/dL — ABNORMAL LOW (ref 13.2–17.1)
LYMPHS PCT: 6 %
Lymphs Abs: 864 cells/uL (ref 850–3900)
MCH: 29.4 pg (ref 27.0–33.0)
MCHC: 33.4 g/dL (ref 32.0–36.0)
MCV: 87.9 fL (ref 80.0–100.0)
MONOS PCT: 8 %
MPV: 10.7 fL (ref 7.5–12.5)
Monocytes Absolute: 1152 cells/uL — ABNORMAL HIGH (ref 200–950)
NEUTROS ABS: 12384 {cells}/uL — AB (ref 1500–7800)
NEUTROS PCT: 86 %
PLATELETS: 357 10*3/uL (ref 140–400)
RBC: 3.88 MIL/uL — ABNORMAL LOW (ref 4.20–5.80)
RDW: 12.9 % (ref 11.0–15.0)
WBC: 14.4 10*3/uL — ABNORMAL HIGH (ref 3.8–10.8)

## 2016-03-05 LAB — BASIC METABOLIC PANEL WITH GFR
BUN: 33 mg/dL — AB (ref 7–25)
CALCIUM: 8.9 mg/dL (ref 8.6–10.3)
CO2: 27 mmol/L (ref 20–31)
CREATININE: 1.16 mg/dL (ref 0.70–1.25)
Chloride: 92 mmol/L — ABNORMAL LOW (ref 98–110)
GFR, Est African American: 78 mL/min (ref >=60)
GFR, Est Non African American: 68 mL/min (ref >=60)
Glucose, Bld: 314 mg/dL — ABNORMAL HIGH (ref 65–99)
Potassium: 4.2 mmol/L (ref 3.5–5.3)
SODIUM: 132 mmol/L — AB (ref 135–146)

## 2016-03-07 ENCOUNTER — Ambulatory Visit (INDEPENDENT_AMBULATORY_CARE_PROVIDER_SITE_OTHER): Payer: BLUE CROSS/BLUE SHIELD | Admitting: Family Medicine

## 2016-03-07 VITALS — BP 81/59 | HR 115 | Wt 166.0 lb

## 2016-03-07 DIAGNOSIS — IMO0002 Reserved for concepts with insufficient information to code with codable children: Secondary | ICD-10-CM

## 2016-03-07 DIAGNOSIS — I255 Ischemic cardiomyopathy: Secondary | ICD-10-CM

## 2016-03-07 DIAGNOSIS — I5023 Acute on chronic systolic (congestive) heart failure: Secondary | ICD-10-CM

## 2016-03-07 DIAGNOSIS — Z794 Long term (current) use of insulin: Secondary | ICD-10-CM | POA: Diagnosis not present

## 2016-03-07 DIAGNOSIS — E1139 Type 2 diabetes mellitus with other diabetic ophthalmic complication: Secondary | ICD-10-CM | POA: Diagnosis not present

## 2016-03-07 DIAGNOSIS — E1165 Type 2 diabetes mellitus with hyperglycemia: Secondary | ICD-10-CM

## 2016-03-07 NOTE — Progress Notes (Signed)
[facility] is a 61 y.o. male who presents to [place]: [place] today for follow-up diabetes hypotension tachycardia.  Diabetes: Patient has been taking 15 units of insulin for the last several days. He notes blood sugars are hovering around 200 now. He denies polyuria or polydipsia. He's feeling much better and has increased appetite.  Tachycardia and hypotension:  Patient was thought to have dehydration due to her recent illness. He's been abstaining from his Bumex, Zaroxolyn, metoprolol, and losartan.  He notes that his heart rates been running a bit high around 110-115. He denies any lightheadedness or dizziness chest pain or palpitations. He denies significant leg swelling or shortness of breath.   Past Medical History:  Diagnosis Date  . Acute on chronic systolic heart failure ([place]) 05/29/2015  . Diabetes mellitus without complication ([place])   . Hyperlipidemia 01/21/2014   Lipitor started Dec 2015   . Hypertension   . Ischemic cardiomyopathy 10/21/2014   EF 30-35% August 2016, no improvement with medical therapy.  Will be seeing Dr.Preli for possible ICD implantation January 2017    Past Surgical History:  Procedure Laterality Date  . CARDIAC SURGERY    . DENTAL SURGERY    . QUADRICEPS TENDON REPAIR Left 03/16/2012   Procedure: REPAIR QUADRICEP TENDON;  Surgeon: Marybelle Killings, MD;  Location: [place];  Service: Orthopedics;  Laterality: Left;  . TONSILLECTOMY     Social History  Substance Use Topics  . Smoking status: Never Smoker  . Smokeless tobacco: Never Used  . Alcohol use No   family history is not on file.  ROS as above:  Medications: Current Outpatient Prescriptions  Medication Sig Dispense Refill  . AMBULATORY NON FORMULARY MEDICATION Knee high compression stockings to be worn during all waking hours. Dx: Congestive heart failure. 2 Units 11  .  AMBULATORY NON FORMULARY MEDICATION lancets, test strips for 3x daily testing.  Disp QS x1 month E11.39 1 each 12  . aspirin 81 MG tablet Take 81 mg by mouth daily.    Marland Kitchen atorvastatin (LIPITOR) 40 MG tablet Take by mouth.    . bumetanide (BUMEX) 2 MG tablet Take by mouth.    . dapagliflozin propanediol (FARXIGA) 5 MG TABS tablet Take 5 mg by mouth daily. 30 tablet 1  . guaiFENesin-codeine 100-10 MG/5ML syrup Take 5 mLs by mouth at bedtime as needed for cough. 120 mL 0  . insulin degludec (TRESIBA FLEXTOUCH) 100 UNIT/ML SOPN FlexTouch Pen Inject 0.1 mLs (10 Units total) into the skin daily at 10 pm. 1 pen 0  . Insulin Pen Needle (PEN NEEDLES) 32G X 4 MM MISC 1 each by Does not apply route daily. 100 each 1  . ipratropium (ATROVENT) 0.06 % nasal spray Place 2 sprays into both nostrils every 4 (four) hours as needed for rhinitis. 10 mL 6  . KRILL OIL PO Take 350 mg by mouth.    . losartan (COZAAR) 25 MG tablet Take 1 tablet (25 mg total) by mouth daily. 90 tablet 0  . Lutein 6 MG CAPS Take by mouth.    . metFORMIN (GLUCOPHAGE) 1000 MG tablet Take 1 tablet (1,000 mg total) by mouth 2 (two) times daily. 180 tablet 1  . metolazone (ZAROXOLYN) 5 MG tablet TAKE 1 TABLET BY MOUTH EVERY MORNING REPLACING AM BUMEX DOSE IF NEEDED FOR ANKLE SWELLING 30 tablet 2  . metoprolol succinate (TOPROL-XL) 25 MG 24 hr tablet Take 1 tablet (25 mg total)  by mouth daily. 90 tablet 0  . multivitamin-lutein (OCUVITE-LUTEIN) CAPS Take 1 capsule by mouth daily.    . potassium chloride SA (K-DUR,KLOR-CON) 20 MEQ tablet Take 1 tablet (20 mEq total) by mouth 3 (three) times daily. 90 tablet 1  . saxagliptin HCl (ONGLYZA) 5 MG TABS tablet Take 1 tablet (5 mg total) by mouth daily. 90 tablet 0  . spironolactone (ALDACTONE) 25 MG tablet Take 1 tablet (25 mg total) by mouth daily. 90 tablet 1   No current facility-administered medications for this visit.    Allergies  Allergen Reactions  . Lisinopril     SOB    Health  Maintenance Health Maintenance  Topic Date Due  . ZOSTAVAX  10/05/2016 (Originally 12/09/2014)  . COLONOSCOPY  02/03/2029 (Originally 12/08/2004)  . HIV Screening  02/03/2029 (Originally 12/08/1970)  . OPHTHALMOLOGY EXAM  04/27/2016  . HEMOGLOBIN A1C  07/12/2016  . FOOT EXAM  10/31/2016  . PNEUMOCOCCAL POLYSACCHARIDE VACCINE (2) 10/31/2020  . TETANUS/TDAP  10/31/2025  . INFLUENZA VACCINE  Completed  . Hepatitis C Screening  Completed     Exam:  BP (!) 81/59   Pulse (!) 115   Wt 166 lb (75.3 kg)   BMI 23.82 kg/m   Wt Readings from Last 3 Encounters:  03/07/16 166 lb (75.3 kg)  03/04/16 162 lb (73.5 kg)  03/01/16 157 lb (71.2 kg)    Gen: Well NAD Nontoxic appearing HEENT: EOMI,  MMM Lungs: Normal work of breathing. CTABL Heart: RRR no MRG Abd: NABS, Soft. Nondistended, Nontender Exts: Brisk capillary refill, warm and well perfused. Trace edema bilateral legs to shin   Results for orders placed or performed in visit on 03/04/16 (from the past 72 hour(s))  CBC with Differential/Platelet     Status: Abnormal   Collection Time: 03/04/16 10:00 AM  Result Value Ref Range   WBC 14.4 (H) 3.8 - 10.8 K/uL   RBC 3.88 (L) 4.20 - 5.80 MIL/uL   Hemoglobin 11.4 (L) 13.2 - 17.1 g/dL   HCT 34.1 (L) 38.5 - 50.0 %   MCV 87.9 80.0 - 100.0 fL   MCH 29.4 27.0 - 33.0 pg   MCHC 33.4 32.0 - 36.0 g/dL   RDW 12.9 11.0 - 15.0 %   Platelets 357 140 - 400 K/uL   MPV 10.7 7.5 - 12.5 fL   Neutro Abs 12,384 (H) 1,500 - 7,800 cells/uL   Lymphs Abs 864 850 - 3,900 cells/uL   Monocytes Absolute 1,152 (H) 200 - 950 cells/uL   Eosinophils Absolute 0 (L) 15 - 500 cells/uL   Basophils Absolute 0 0 - 200 cells/uL   Neutrophils Relative % 86 %   Lymphocytes Relative 6 %   Monocytes Relative 8 %   Eosinophils Relative 0 %   Basophils Relative 0 %   Smear Review Criteria for review not met   BASIC METABOLIC PANEL WITH GFR     Status: Abnormal   Collection Time: 03/04/16 10:00 AM  Result Value Ref Range    Sodium 132 (L) 135 - 146 mmol/L   Potassium 4.2 3.5 - 5.3 mmol/L   Chloride 92 (L) 98 - 110 mmol/L   CO2 27 20 - 31 mmol/L   Glucose, Bld 314 (H) 65 - 99 mg/dL   BUN 33 (H) 7 - 25 mg/dL   Creat 1.16 0.70 - 1.25 mg/dL    Comment:   For patients > or = 62 years of age: The upper reference limit for Creatinine is approximately 13% higher  for people identified as African-American.      Calcium 8.9 8.6 - 10.3 mg/dL   GFR, Est African American 78 >=60 mL/min   GFR, Est Non African American 68 >=60 mL/min   No results found.    Assessment and Plan: 61 y.o. male with  Diabetes: Improving. Blood sugar still running high. Increase to 20 units daily of insulin.  Hypotension and tachycardia:  Patient looks a lot better than his vital signs stable. He is well-appearing and seems comfortable and is able to exert himself from the clinic. I think the tachycardia is probably due to the fact that he is not taking metoprolol. However his blood pressures below not sure he'll be able to take Toprol right now. Additionally he has been gaining weight which I think is related to fluid accumulation. Plan to restart the diuretics and recheck next week. If blood pressure starts going up above 099 systolic Will restart the Toprol.   No orders of the defined types were placed in this encounter.   Discussed warning signs or symptoms. Please see discharge instructions. Patient expresses understanding.

## 2016-03-07 NOTE — Patient Instructions (Signed)
Thank you for coming in today. Restart fluid pills.  Increase insulin to 20 units daily.  Recheck next week.  Restart metoprolol if BP is getting about 100 Call or go to the emergency room if you get worse, have trouble breathing, have chest pains, or palpitations.

## 2016-03-13 ENCOUNTER — Ambulatory Visit (INDEPENDENT_AMBULATORY_CARE_PROVIDER_SITE_OTHER): Payer: BLUE CROSS/BLUE SHIELD | Admitting: Family Medicine

## 2016-03-13 ENCOUNTER — Encounter: Payer: Self-pay | Admitting: Family Medicine

## 2016-03-13 VITALS — BP 82/55 | HR 121 | Temp 97.4°F | Wt 169.0 lb

## 2016-03-13 DIAGNOSIS — I5023 Acute on chronic systolic (congestive) heart failure: Secondary | ICD-10-CM

## 2016-03-13 DIAGNOSIS — E876 Hypokalemia: Secondary | ICD-10-CM

## 2016-03-13 DIAGNOSIS — E1139 Type 2 diabetes mellitus with other diabetic ophthalmic complication: Secondary | ICD-10-CM

## 2016-03-13 DIAGNOSIS — I10 Essential (primary) hypertension: Secondary | ICD-10-CM | POA: Diagnosis not present

## 2016-03-13 DIAGNOSIS — Z794 Long term (current) use of insulin: Secondary | ICD-10-CM

## 2016-03-13 DIAGNOSIS — E1165 Type 2 diabetes mellitus with hyperglycemia: Secondary | ICD-10-CM

## 2016-03-13 DIAGNOSIS — IMO0002 Reserved for concepts with insufficient information to code with codable children: Secondary | ICD-10-CM

## 2016-03-13 DIAGNOSIS — I255 Ischemic cardiomyopathy: Secondary | ICD-10-CM

## 2016-03-13 LAB — COMPLETE METABOLIC PANEL WITH GFR
ALBUMIN: 2.8 g/dL — AB (ref 3.6–5.1)
ALK PHOS: 60 U/L (ref 40–115)
ALT: 13 U/L (ref 9–46)
AST: 15 U/L (ref 10–35)
BUN: 43 mg/dL — AB (ref 7–25)
CHLORIDE: 102 mmol/L (ref 98–110)
CO2: 24 mmol/L (ref 20–31)
Calcium: 8.7 mg/dL (ref 8.6–10.3)
Creat: 1.54 mg/dL — ABNORMAL HIGH (ref 0.70–1.25)
GFR, Est African American: 55 mL/min — ABNORMAL LOW (ref >=60)
GFR, Est Non African American: 48 mL/min — ABNORMAL LOW (ref >=60)
GLUCOSE: 161 mg/dL — AB (ref 65–99)
POTASSIUM: 4.7 mmol/L (ref 3.5–5.3)
SODIUM: 139 mmol/L (ref 135–146)
Total Bilirubin: 0.5 mg/dL (ref 0.2–1.2)
Total Protein: 5 g/dL — ABNORMAL LOW (ref 6.1–8.1)

## 2016-03-13 LAB — CBC
HCT: 29 % — ABNORMAL LOW (ref 38.5–50.0)
HEMOGLOBIN: 9.3 g/dL — AB (ref 13.2–17.1)
MCH: 28.4 pg (ref 27.0–33.0)
MCHC: 32.1 g/dL (ref 32.0–36.0)
MCV: 88.7 fL (ref 80.0–100.0)
MPV: 10.3 fL (ref 7.5–12.5)
Platelets: 443 10*3/uL — ABNORMAL HIGH (ref 140–400)
RBC: 3.27 MIL/uL — AB (ref 4.20–5.80)
RDW: 13.6 % (ref 11.0–15.0)
WBC: 8.8 10*3/uL (ref 3.8–10.8)

## 2016-03-13 MED ORDER — INSULIN DEGLUDEC 100 UNIT/ML ~~LOC~~ SOPN: 20.0000 [IU] | PEN_INJECTOR | Freq: Every day | SUBCUTANEOUS | 2 refills | Status: AC

## 2016-03-13 MED ORDER — SPIRONOLACTONE 25 MG PO TABS: 12.5000 mg | ORAL_TABLET | Freq: Every day | ORAL | 1 refills | Status: DC

## 2016-03-13 NOTE — Patient Instructions (Signed)
Thank you for coming in today. Continue the medicines per cardiology.  Continue insulin at 20 units daily.  I have sent a prescription for the insulin to the pharmacy.  Let me know if we need to switch brands.  If we are switching let me know which pharmacy we need to send it to.  If you are about to run out just stop by and we will get you a sample.   Recheck in 1-2 weeks.   Get labs today. We may change plans on lab results.

## 2016-03-13 NOTE — Progress Notes (Signed)
Peter Griffin is a 61 y.o. male who presents to [facility]: Primary Care Sports Medicine today for follow up hypotension, tachycardia and diabetes.   Pt has been seen multiple times recently following a respiratory illness where his diabetes worsened as did his hypotension and tachycardia. His blood pressure medicines and diuretics were held. He saw his cardiologist on 03/08/16 who restart Bumex and held Zaroxolyn He restarted spironolactone at 12.5 mg daily. Additionally he restarted metoprolol 25 mg daily.    Diabetes: Patient notes he is doing better with a fasting blood sugar from 118-208 nonfasting blood sugar in the 3 or 400s. Overall he has less fatigue. He notes he has not been taking has Onglyza.  He continues to take [facility] Griffin insulin 20 units daily.    Past Medical History:  Diagnosis Date  . Acute on chronic systolic heart failure (HCC) 05/29/2015  . Diabetes mellitus without complication (HCC)   . Hyperlipidemia 01/21/2014   Lipitor started Dec 2015   . Hypertension   . Ischemic cardiomyopathy 10/21/2014   EF 30-35% August 2016, no improvement with medical therapy.  Will be seeing Peter Griffin for possible ICD implantation January 2017    Past Surgical History:  Procedure Laterality Date  . CARDIAC SURGERY    . DENTAL SURGERY    . QUADRICEPS TENDON REPAIR Left 03/16/2012   Procedure: REPAIR QUADRICEP TENDON;  Surgeon: Peter Manges, MD;  Location: [facility] OR;  Service: Orthopedics;  Laterality: Left;  . TONSILLECTOMY     Social History  Substance Use Topics  . Smoking status: Never Smoker  . Smokeless tobacco: Never Used  . Alcohol use No   family history is not on file.  ROS as above:  Medications: Current Outpatient Prescriptions  Medication Sig Dispense Refill  . AMBULATORY NON FORMULARY MEDICATION Knee high compression stockings to be worn during all waking hours. Dx: Congestive  heart failure. 2 Units 11  . AMBULATORY NON FORMULARY MEDICATION lancets, test strips for 3x daily testing.  Disp QS x1 month E11.39 1 each 12  . aspirin 81 MG tablet Take 81 mg by mouth daily.    Marland Kitchen atorvastatin (LIPITOR) 40 MG tablet Take by mouth.    . bumetanide (BUMEX) 2 MG tablet Take by mouth.    Marland Kitchen guaiFENesin-codeine 100-10 MG/5ML syrup Take 5 mLs by mouth at bedtime as needed for cough. 120 mL 0  . insulin degludec (TRESIBA FLEXTOUCH) 100 UNIT/ML SOPN FlexTouch Pen Inject 0.2 mLs (20 Units total) into the skin daily at 10 pm. 3 pen 2  . Insulin Pen Needle (PEN NEEDLES) 32G X 4 MM MISC 1 each by Does not apply route daily. 100 each 1  . ipratropium (ATROVENT) 0.06 % nasal spray Place 2 sprays into both nostrils every 4 (four) hours as needed for rhinitis. 10 mL 6  . KRILL OIL PO Take 350 mg by mouth.    . losartan (COZAAR) 25 MG tablet Take 1 tablet (25 mg total) by mouth daily. 90 tablet 0  . Lutein 6 MG CAPS Take by mouth.    . metFORMIN (GLUCOPHAGE) 1000 MG tablet Take 1 tablet (1,000 mg total) by mouth 2 (two) times daily. 180 tablet 1  . metoprolol succinate (TOPROL-XL) 25 MG 24 hr tablet Take 1 tablet (25 mg total) by mouth daily. 90 tablet 0  . multivitamin-lutein (OCUVITE-LUTEIN) CAPS Take 1 capsule by mouth daily.    . potassium chloride SA (K-DUR,KLOR-CON) 20 MEQ tablet Take 1 tablet (  20 mEq total) by mouth 3 (three) times daily. 90 tablet 1  . saxagliptin HCl (ONGLYZA) 5 MG TABS tablet Take 1 tablet (5 mg total) by mouth daily. 90 tablet 0  . spironolactone (ALDACTONE) 25 MG tablet Take 0.5 tablets (12.5 mg total) by mouth daily. 90 tablet 1   No current facility-administered medications for this visit.    Allergies  Allergen Reactions  . Lisinopril     SOB    Health Maintenance Health Maintenance  Topic Date Due  . ZOSTAVAX  10/05/2016 (Originally 12/09/2014)  . COLONOSCOPY  02/03/2029 (Originally 12/08/2004)  . HIV Screening  02/03/2029 (Originally 12/08/1970)  .  OPHTHALMOLOGY EXAM  04/27/2016  . HEMOGLOBIN A1C  07/12/2016  . FOOT EXAM  10/31/2016  . PNEUMOCOCCAL POLYSACCHARIDE VACCINE (2) 10/31/2020  . TETANUS/TDAP  10/31/2025  . INFLUENZA VACCINE  Completed  . Hepatitis C Screening  Completed     Exam:  BP (!) 82/55   Pulse (!) 121   Temp 97.4 F (36.3 C) (Oral)   Wt 169 lb (76.7 kg)   BMI 24.25 kg/m  Gen: Well NAD HEENT: EOMI,  MMM Lungs: Normal work of breathing. CTABL Heart: Tachycardia present regular rhythm no MRG Abd: NABS, Soft. Nondistended, Nontender Exts: Brisk capillary refill, warm and well perfused. Trace edema bilateral lower extremities   No results found for this or any previous visit (from the past 72 hour(s)). No results found.    Assessment and Plan: 61 y.o. male with  Diabetes: Improved. Continue 20 units of insulin. Prescribed [facility]. Will switch brands if needed based on insurance. Restart Onglyza. Recheck in 1-2 weeks. Obtain labs listed below.  Cardiac: Heart failure hypotension and tachycardia. Patient appears well however he has gained a bit of weight and is starting to have some lower extremity swelling now. Plan to continue Bumex daily to twice daily. Continue beta blocker. Recheck in 1-2 weeks. Get labs listed below. Patient has a follow-up appoint with cardiology in about 3 weeks.   Orders Placed This Encounter  Procedures  . CBC  . COMPLETE METABOLIC PANEL WITH GFR  . B Nat Peptide  . Magnesium   Meds ordered this encounter  Medications  . insulin degludec (TRESIBA FLEXTOUCH) 100 UNIT/ML SOPN FlexTouch Pen    Sig: Inject 0.2 mLs (20 Units total) into the skin daily at 10 pm.    Dispense:  3 pen    Refill:  2  . spironolactone (ALDACTONE) 25 MG tablet    Sig: Take 0.5 tablets (12.5 mg total) by mouth daily.    Dispense:  90 tablet    Refill:  1     Discussed warning signs or symptoms. Please see discharge instructions. Patient expresses understanding.

## 2016-03-14 LAB — MAGNESIUM: Magnesium: 1.8 mg/dL (ref 1.5–2.5)

## 2016-03-14 LAB — BRAIN NATRIURETIC PEPTIDE: Brain Natriuretic Peptide: 1996.9 pg/mL — ABNORMAL HIGH (ref <100)

## 2016-03-18 ENCOUNTER — Telehealth: Payer: Self-pay | Admitting: Family Medicine

## 2016-03-18 NOTE — Telephone Encounter (Signed)
What is the preferred medicine? Ok to give sample again until we know

## 2016-03-18 NOTE — Telephone Encounter (Signed)
Pt states insurance company will not cover Evaristo Buryresiba, will route to PCP for new Rx. I have placed two samples for Pt to hold him over until Rx can be sent.

## 2016-03-18 NOTE — Telephone Encounter (Signed)
Pt will call insurance company then let PCP know what Rx is preferred.

## 2016-03-26 NOTE — Telephone Encounter (Signed)
Preferred meds are lantus,levemir,toujeo

## 2016-03-27 ENCOUNTER — Ambulatory Visit (INDEPENDENT_AMBULATORY_CARE_PROVIDER_SITE_OTHER): Payer: BLUE CROSS/BLUE SHIELD | Admitting: Family Medicine

## 2016-03-27 ENCOUNTER — Encounter: Payer: Self-pay | Admitting: Family Medicine

## 2016-03-27 VITALS — BP 84/63 | HR 110 | Temp 98.2°F | Wt 158.0 lb

## 2016-03-27 DIAGNOSIS — I5023 Acute on chronic systolic (congestive) heart failure: Secondary | ICD-10-CM

## 2016-03-27 DIAGNOSIS — E1139 Type 2 diabetes mellitus with other diabetic ophthalmic complication: Secondary | ICD-10-CM

## 2016-03-27 DIAGNOSIS — IMO0002 Reserved for concepts with insufficient information to code with codable children: Secondary | ICD-10-CM

## 2016-03-27 DIAGNOSIS — E1165 Type 2 diabetes mellitus with hyperglycemia: Secondary | ICD-10-CM

## 2016-03-27 DIAGNOSIS — I10 Essential (primary) hypertension: Secondary | ICD-10-CM

## 2016-03-27 DIAGNOSIS — I255 Ischemic cardiomyopathy: Secondary | ICD-10-CM

## 2016-03-27 DIAGNOSIS — Z794 Long term (current) use of insulin: Secondary | ICD-10-CM

## 2016-03-27 MED ORDER — INSULIN GLARGINE 300 UNIT/ML ~~LOC~~ SOPN: 20.0000 [IU] | PEN_INJECTOR | Freq: Every day | SUBCUTANEOUS | 2 refills | Status: AC

## 2016-03-27 NOTE — Patient Instructions (Addendum)
Thank you for coming in today. Recheck in 1-2 months.  Let me know how the sugars do on the Toujeo.  If you are constantly getting less than 100 in the morning you can decrease to 15 units.   Let me know how you are doing.

## 2016-03-27 NOTE — Addendum Note (Signed)
Addended by: Rodolph BongOREY, Kingstyn Deruiter S on: 03/27/2016 07:46 AM   Modules accepted: Orders

## 2016-03-27 NOTE — Telephone Encounter (Signed)
Will switch to [employer]

## 2016-03-27 NOTE — Progress Notes (Signed)
Peter Griffin is a 61 y.o. male who presents to [name]: Primary Care Sports Medicine today for follow-up diabetes hypotension and heart failure.  Patient was seen several times after recent illness that seemed to worsen both his diabetes and his hypotension.  He is back to most of his medications and feeling pretty well today. He denies lightheadedness or dizziness or significant ankle swelling or pain.  Diabetes: Patient continues to use 20 units of Tresiba insulin daily. This was initially given as a sample and he notes his insurance requests Toujeo. He has not started using the Toujeo insulin yet. He brings a blood sugar log with him today showing fasting sugars in the low 100s to low 200s and non-fasting sugars typically in the high 100s or low 200s.  He also uses Onglyza daily. He denies polyuria or polydipsia or hypo-glycemic episodes.  Hypotension/systolic heart failure:  Patient is feeling pretty well with the medications listed below. He notes his blood pressures at home are still in the low range typically systolic less than 100. He notes his heart rate is now little bit slower typically in the low 100s usually less than 110. He denies lightheadedness or dizziness. He has resumed his diuretics and denies any shortness of breath. He notes his weight has returned to a normal level around 158.  Overall he feels pretty well. He notes he has a follow-up cardiology visit in about 2 days.   Past Medical History:  Diagnosis Date  . Acute on chronic systolic heart failure (HCC) 05/29/2015  . Diabetes mellitus without complication (HCC)   . Hyperlipidemia 01/21/2014   Lipitor started Dec 2015   . Hypertension   . Ischemic cardiomyopathy 10/21/2014   EF 30-35% August 2016, no improvement with medical therapy.  Will be seeing Dr.Preli for possible ICD implantation January 2017    Past Surgical  History:  Procedure Laterality Date  . CARDIAC SURGERY    . DENTAL SURGERY    . QUADRICEPS TENDON REPAIR Left 03/16/2012   Procedure: REPAIR QUADRICEP TENDON;  Surgeon: Eldred Manges, MD;  Location: [facility] OR;  Service: Orthopedics;  Laterality: Left;  . TONSILLECTOMY     Social History  Substance Use Topics  . Smoking status: Never Smoker  . Smokeless tobacco: Never Used  . Alcohol use No   family history is not on file.  ROS as above:  Medications: Current Outpatient Prescriptions  Medication Sig Dispense Refill  . AMBULATORY NON FORMULARY MEDICATION Knee high compression stockings to be worn during all waking hours. Dx: Congestive heart failure. 2 Units 11  . AMBULATORY NON FORMULARY MEDICATION lancets, test strips for 3x daily testing.  Disp QS x1 month E11.39 1 each 12  . aspirin 81 MG tablet Take 81 mg by mouth daily.    Marland Kitchen atorvastatin (LIPITOR) 40 MG tablet Take by mouth.    . bumetanide (BUMEX) 2 MG tablet Take by mouth.    Marland Kitchen guaiFENesin-codeine 100-10 MG/5ML syrup Take 5 mLs by mouth at bedtime as needed for cough. 120 mL 0  . insulin degludec (TRESIBA FLEXTOUCH) 100 UNIT/ML SOPN FlexTouch Pen Inject 0.2 mLs (20 Units total) into the skin daily at 10 pm. 3 pen 2  . Insulin Glargine (TOUJEO SOLOSTAR) 300 UNIT/ML SOPN Inject 20 Units into the skin daily. 3 pen 2  . Insulin Pen Needle (PEN NEEDLES) 32G X 4 MM MISC 1 each by Does not apply route daily. 100 each 1  . ipratropium (  ATROVENT) 0.06 % nasal spray Place 2 sprays into both nostrils every 4 (four) hours as needed for rhinitis. 10 mL 6  . KRILL OIL PO Take 350 mg by mouth.    . losartan (COZAAR) 25 MG tablet Take 1 tablet (25 mg total) by mouth daily. 90 tablet 0  . Lutein 6 MG CAPS Take by mouth.    . metFORMIN (GLUCOPHAGE) 1000 MG tablet Take 1 tablet (1,000 mg total) by mouth 2 (two) times daily. 180 tablet 1  . metoprolol succinate (TOPROL-XL) 25 MG 24 hr tablet Take 1 tablet (25 mg total) by mouth daily. 90 tablet 0    . multivitamin-lutein (OCUVITE-LUTEIN) CAPS Take 1 capsule by mouth daily.    . potassium chloride SA (K-DUR,KLOR-CON) 20 MEQ tablet Take 1 tablet (20 mEq total) by mouth 3 (three) times daily. 90 tablet 1  . saxagliptin HCl (ONGLYZA) 5 MG TABS tablet Take 1 tablet (5 mg total) by mouth daily. 90 tablet 0  . spironolactone (ALDACTONE) 25 MG tablet Take 0.5 tablets (12.5 mg total) by mouth daily. 90 tablet 1   No current facility-administered medications for this visit.    Allergies  Allergen Reactions  . Lisinopril     SOB    Health Maintenance Health Maintenance  Topic Date Due  . COLONOSCOPY  02/03/2029 (Originally 12/08/2004)  . HIV Screening  02/03/2029 (Originally 12/08/1970)  . OPHTHALMOLOGY EXAM  04/27/2016  . HEMOGLOBIN A1C  07/12/2016  . FOOT EXAM  10/31/2016  . PNEUMOCOCCAL POLYSACCHARIDE VACCINE (2) 10/31/2020  . TETANUS/TDAP  10/31/2025  . INFLUENZA VACCINE  Completed  . Hepatitis C Screening  Completed     Exam:  BP (!) 84/63   Pulse (!) 110   Temp 98.2 F (36.8 C) (Oral)   Wt 158 lb (71.7 kg)   SpO2 99%   BMI 22.67 kg/m  Gen: Well NAD HEENT: EOMI,  MMM no increased JVD Lungs: Normal work of breathing. CTABL Heart: mild Tachycardia no MRG Abd: NABS, Soft. Nondistended, Nontender Exts: Brisk capillary refill, warm and well perfused. No edema   No results found for this or any previous visit (from the past 72 hour(s)). No results found.    Assessment and Plan: 61 y.o. male with  Diabetes: Improving. Continue current regimen. Switch to [employer] for insurance reasons. Recheck in a month or 2. Patient will send diabetic logs after one week of switching.  Heart failure/hypertension: Seems to be improved. Patient looks well today. Appreciate recommendations from cardiology. Recheck in a month or 2.   No orders of the defined types were placed in this encounter.  No orders of the defined types were placed in this encounter.    Discussed warning signs  or symptoms. Please see discharge instructions. Patient expresses understanding.

## 2016-03-29 NOTE — Telephone Encounter (Signed)
Patient aware.

## 2016-04-07 ENCOUNTER — Other Ambulatory Visit: Payer: Self-pay | Admitting: Family Medicine

## 2016-04-08 ENCOUNTER — Other Ambulatory Visit: Payer: Self-pay | Admitting: Family Medicine

## 2016-04-16 ENCOUNTER — Encounter: Payer: Self-pay | Admitting: Family Medicine

## 2016-04-16 ENCOUNTER — Ambulatory Visit (INDEPENDENT_AMBULATORY_CARE_PROVIDER_SITE_OTHER): Payer: BLUE CROSS/BLUE SHIELD

## 2016-04-16 ENCOUNTER — Ambulatory Visit (INDEPENDENT_AMBULATORY_CARE_PROVIDER_SITE_OTHER): Payer: BLUE CROSS/BLUE SHIELD | Admitting: Family Medicine

## 2016-04-16 ENCOUNTER — Telehealth: Payer: Self-pay | Admitting: Family Medicine

## 2016-04-16 VITALS — BP 101/68 | HR 124 | Temp 97.7°F | Wt 162.0 lb

## 2016-04-16 DIAGNOSIS — R05 Cough: Secondary | ICD-10-CM

## 2016-04-16 DIAGNOSIS — R059 Cough, unspecified: Secondary | ICD-10-CM

## 2016-04-16 DIAGNOSIS — J9811 Atelectasis: Secondary | ICD-10-CM | POA: Diagnosis not present

## 2016-04-16 DIAGNOSIS — J90 Pleural effusion, not elsewhere classified: Secondary | ICD-10-CM | POA: Diagnosis not present

## 2016-04-16 MED ORDER — BENZONATATE 200 MG PO CAPS: 200.0000 mg | ORAL_CAPSULE | Freq: Three times a day (TID) | ORAL | 0 refills | Status: DC | PRN

## 2016-04-16 MED ORDER — ALBUTEROL SULFATE HFA 108 (90 BASE) MCG/ACT IN AERS: 2.0000 | INHALATION_SPRAY | Freq: Four times a day (QID) | RESPIRATORY_TRACT | 0 refills | Status: AC | PRN

## 2016-04-16 NOTE — Telephone Encounter (Signed)
Called Pt, appt made for tomorrow for eval in office.

## 2016-04-16 NOTE — Progress Notes (Addendum)
Peter Griffin is a 61 y.o. male who presents to [name]: Primary Care Sports Medicine today for cough and congestion. Patient notes a several day history of cough. This is associated with mild wheezing. He denies severe shortness of breath leg swelling or orthopnea. He continues to maintain a normal weight for him around 160 pounds. He notes normal urine output. He denies significant chest pain. No fevers or chills.   Past Medical History:  Diagnosis Date  . Acute on chronic systolic heart failure (HCC) 05/29/2015  . Diabetes mellitus without complication (HCC)   . Hyperlipidemia 01/21/2014   Lipitor started Dec 2015   . Hypertension   . Ischemic cardiomyopathy 10/21/2014   EF 30-35% August 2016, no improvement with medical therapy.  Will be seeing Dr.Preli for possible ICD implantation January 2017    Past Surgical History:  Procedure Laterality Date  . CARDIAC SURGERY    . DENTAL SURGERY    . QUADRICEPS TENDON REPAIR Left 03/16/2012   Procedure: REPAIR QUADRICEP TENDON;  Surgeon: Eldred Manges, MD;  Location: [facility] OR;  Service: Orthopedics;  Laterality: Left;  . TONSILLECTOMY     Social History  Substance Use Topics  . Smoking status: Never Smoker  . Smokeless tobacco: Never Used  . Alcohol use No   family history is not on file.  ROS as above:  Medications: Current Outpatient Prescriptions  Medication Sig Dispense Refill  . AMBULATORY NON FORMULARY MEDICATION Knee high compression stockings to be worn during all waking hours. Dx: Congestive heart failure. 2 Units 11  . AMBULATORY NON FORMULARY MEDICATION lancets, test strips for 3x daily testing.  Disp QS x1 month E11.39 1 each 12  . aspirin 81 MG tablet Take 81 mg by mouth daily.    Marland Kitchen atorvastatin (LIPITOR) 40 MG tablet Take by mouth.    . bumetanide (BUMEX) 2 MG tablet Take by mouth.    . insulin degludec (TRESIBA FLEXTOUCH)  100 UNIT/ML SOPN FlexTouch Pen Inject 0.2 mLs (20 Units total) into the skin daily at 10 pm. 3 pen 2  . Insulin Glargine (TOUJEO SOLOSTAR) 300 UNIT/ML SOPN Inject 20 Units into the skin daily. 3 pen 2  . Insulin Pen Needle (PEN NEEDLES) 32G X 4 MM MISC 1 each by Does not apply route daily. 100 each 1  . ipratropium (ATROVENT) 0.06 % nasal spray Place 2 sprays into both nostrils every 4 (four) hours as needed for rhinitis. 10 mL 6  . KRILL OIL PO Take 350 mg by mouth.    . Lutein 6 MG CAPS Take by mouth.    . metoprolol succinate (TOPROL-XL) 25 MG 24 hr tablet TAKE 1 TABLET (25 MG TOTAL) BY MOUTH DAILY. 90 tablet 0  . multivitamin-lutein (OCUVITE-LUTEIN) CAPS Take 1 capsule by mouth daily.    . ONGLYZA 5 MG TABS tablet TAKE 1 TABLET (5 MG TOTAL) BY MOUTH DAILY. 90 tablet 0  . potassium chloride SA (K-DUR,KLOR-CON) 20 MEQ tablet Take 1 tablet (20 mEq total) by mouth 3 (three) times daily. 90 tablet 1  . albuterol (PROVENTIL HFA;VENTOLIN HFA) 108 (90 Base) MCG/ACT inhaler Inhale 2 puffs into the lungs every 6 (six) hours as needed for wheezing or shortness of breath. 1 Inhaler 0  . benzonatate (TESSALON) 200 MG capsule Take 1 capsule (200 mg total) by mouth 3 (three) times daily as needed for cough. 45 capsule 0  . losartan (COZAAR) 25 MG tablet TAKE 1 TABLET (25 MG TOTAL)  BY MOUTH DAILY. (Patient not taking: Reported on 04/16/2016) 90 tablet 0  . metFORMIN (GLUCOPHAGE) 1000 MG tablet Take 1 tablet (1,000 mg total) by mouth 2 (two) times daily. (Patient not taking: Reported on 04/16/2016) 180 tablet 1   No current facility-administered medications for this visit.    Allergies  Allergen Reactions  . Lisinopril     SOB    Health Maintenance Health Maintenance  Topic Date Due  . COLONOSCOPY  02/03/2029 (Originally 12/08/2004)  . HIV Screening  02/03/2029 (Originally [birth year]/1969)  . OPHTHALMOLOGY EXAM  04/27/2016  . HEMOGLOBIN A1C  07/12/2016  . FOOT EXAM  10/31/2016  . PNEUMOCOCCAL POLYSACCHARIDE  VACCINE (2) 10/31/2020  . TETANUS/TDAP  10/31/2025  . INFLUENZA VACCINE  Completed  . Hepatitis C Screening  Completed     Exam:  BP 101/68   Pulse (!) 124   Temp 97.7 F (36.5 C) (Oral)   Wt 162 lb (73.5 kg)   SpO2 96%   BMI 23.24 kg/m   Wt Readings from Last 10 Encounters:  04/16/16 162 lb (73.5 kg)  03/27/16 158 lb (71.7 kg)  03/13/16 169 lb (76.7 kg)  03/07/16 166 lb (75.3 kg)  03/04/16 162 lb (73.5 kg)  03/01/16 157 lb (71.2 kg)  02/29/16 156 lb (70.8 kg)  02/27/16 160 lb (72.6 kg)  01/12/16 163 lb (73.9 kg)  12/13/15 160 lb (72.6 kg)    Gen: Well NAD HEENT: EOMI,  MMM no increased JVD Lungs: Normal work of breathing. Decreased breath sound right lung field. Course sounds left. Heart: tachy but regular no MRG Abd: NABS, Soft. Nondistended, Nontender Exts: Brisk capillary refill, warm and well perfused.    No results found for this or any previous visit (from the past 72 hour(s)). Dg Chest 2 View  Result Date: 04/16/2016 CLINICAL DATA:  61 y/o  M; 1 week of cough. EXAM: CHEST  2 VIEW COMPARISON:  03/01/2016 chest radiograph. FINDINGS: Large right pleural effusion with leftward mediastinal shift. Atelectasis of the right middle and lower lobes. Coronary stents. Clear left lung. Mild degenerative changes of the spine. Stable cardiac silhouette. IMPRESSION: Interval development of a large right pleural effusion with atelectasis of right middle and lower lobes. Electronically Signed   By: Mitzi Hansen M.D.   On: 04/16/2016 17:11     Assessment and Plan: 61 y.o. male with cough congestion and some shortness of breath. At the time of discharge to assure results were not available. I was concerned for bronchitis or possibly pneumonia. Based on x-ray results above showing a large pleural effusion this is concerning. I reached and attempted to contact the patient but I am unable to do so. Plan have patient return to clinic tomorrow to discuss plan of care. If he  worsens overnight plan to go to the emergency room. Ultimately this may require hospitalization but it's possible with able to do this with avoiding the emergency room.   Orders Placed This Encounter  Procedures  . DG Chest 2 View    Order Specific Question:   Reason for exam:    Answer:   Cough, assess intra-thoracic pathology    Order Specific Question:   Preferred imaging location?    Answer:   [facility]  . CBC  . COMPLETE METABOLIC PANEL WITH GFR   Meds ordered this encounter  Medications  . albuterol (PROVENTIL HFA;VENTOLIN HFA) 108 (90 Base) MCG/ACT inhaler    Sig: Inhale 2 puffs into the lungs every 6 (six) hours as needed for  wheezing or shortness of breath.    Dispense:  1 Inhaler    Refill:  0  . benzonatate (TESSALON) 200 MG capsule    Sig: Take 1 capsule (200 mg total) by mouth 3 (three) times daily as needed for cough.    Dispense:  45 capsule    Refill:  0     Discussed warning signs or symptoms. Please see discharge instructions. Patient expresses understanding.

## 2016-04-16 NOTE — Telephone Encounter (Signed)
I got ahold of Mr Rolley SimsHampton this evening. He is feeling well. He will return to clinic tomorrow morning for recheck.

## 2016-04-16 NOTE — Patient Instructions (Signed)
Thank you for coming in today. Continue the cough medicine over the counter.  Also take zyrtec (certizine) over the counter for allergies.  Use tessalon for cough as needed.  Get xray and labs today.  Use the inhaler as needed.  Call or go to the emergency room if you get worse, have trouble breathing, have chest pains, or palpitations.    Cough, Adult A cough helps to clear your throat and lungs. A cough may last only 2-3 weeks (acute), or it may last longer than 8 weeks (chronic). Many different things can cause a cough. A cough may be a sign of an illness or another medical condition. Follow these instructions at home:  Pay attention to any changes in your cough.  Take medicines only as told by your doctor.  If you were prescribed an antibiotic medicine, take it as told by your doctor. Do not stop taking it even if you start to feel better.  Talk with your doctor before you try using a cough medicine.  Drink enough fluid to keep your pee (urine) clear or pale yellow.  If the air is dry, use a cold steam vaporizer or humidifier in your home.  Stay away from things that make you cough at work or at home.  If your cough is worse at night, try using extra pillows to raise your head up higher while you sleep.  Do not smoke, and try not to be around smoke. If you need help quitting, ask your doctor.  Do not have caffeine.  Do not drink alcohol.  Rest as needed. Contact a doctor if:  You have new problems (symptoms).  You cough up yellow fluid (pus).  Your cough does not get better after 2-3 weeks, or your cough gets worse.  Medicine does not help your cough and you are not sleeping well.  You have pain that gets worse or pain that is not helped with medicine.  You have a fever.  You are losing weight and you do not know why.  You have night sweats. Get help right away if:  You cough up blood.  You have trouble breathing.  Your heartbeat is very fast. This  information is not intended to replace advice given to you by your health care provider. Make sure you discuss any questions you have with your health care provider. Document Released: 10/04/2010 Document Revised: 06/29/2015 Document Reviewed: 03/30/2014 Elsevier Interactive Patient Education  2017 [employer]

## 2016-04-17 ENCOUNTER — Ambulatory Visit: Payer: Self-pay | Admitting: Family Medicine

## 2016-04-17 ENCOUNTER — Ambulatory Visit (INDEPENDENT_AMBULATORY_CARE_PROVIDER_SITE_OTHER): Payer: BLUE CROSS/BLUE SHIELD | Admitting: Family Medicine

## 2016-04-17 ENCOUNTER — Encounter: Payer: Self-pay | Admitting: Family Medicine

## 2016-04-17 VITALS — BP 96/68 | HR 132 | Temp 98.3°F | Wt 161.0 lb

## 2016-04-17 DIAGNOSIS — IMO0002 Reserved for concepts with insufficient information to code with codable children: Secondary | ICD-10-CM

## 2016-04-17 DIAGNOSIS — D72829 Elevated white blood cell count, unspecified: Secondary | ICD-10-CM | POA: Insufficient documentation

## 2016-04-17 DIAGNOSIS — E1139 Type 2 diabetes mellitus with other diabetic ophthalmic complication: Secondary | ICD-10-CM

## 2016-04-17 DIAGNOSIS — J90 Pleural effusion, not elsewhere classified: Secondary | ICD-10-CM

## 2016-04-17 DIAGNOSIS — Z794 Long term (current) use of insulin: Secondary | ICD-10-CM | POA: Diagnosis not present

## 2016-04-17 DIAGNOSIS — E1165 Type 2 diabetes mellitus with hyperglycemia: Secondary | ICD-10-CM | POA: Diagnosis not present

## 2016-04-17 LAB — CBC
HEMATOCRIT: 34.1 % — AB (ref 38.5–50.0)
HEMOGLOBIN: 10.6 g/dL — AB (ref 13.2–17.1)
MCH: 26.4 pg — AB (ref 27.0–33.0)
MCHC: 31.1 g/dL — AB (ref 32.0–36.0)
MCV: 84.8 fL (ref 80.0–100.0)
MPV: 11.3 fL (ref 7.5–12.5)
Platelets: 470 10*3/uL — ABNORMAL HIGH (ref 140–400)
RBC: 4.02 MIL/uL — ABNORMAL LOW (ref 4.20–5.80)
RDW: 15.3 % — ABNORMAL HIGH (ref 11.0–15.0)
WBC: 25.2 10*3/uL — ABNORMAL HIGH (ref 3.8–10.8)

## 2016-04-17 LAB — COMPLETE METABOLIC PANEL WITH GFR
ALBUMIN: 2.7 g/dL — AB (ref 3.6–5.1)
ALK PHOS: 88 U/L (ref 40–115)
ALT: 13 U/L (ref 9–46)
AST: 19 U/L (ref 10–35)
BUN: 37 mg/dL — AB (ref 7–25)
CALCIUM: 8.2 mg/dL — AB (ref 8.6–10.3)
CO2: 26 mmol/L (ref 20–31)
Chloride: 90 mmol/L — ABNORMAL LOW (ref 98–110)
Creat: 1.31 mg/dL — ABNORMAL HIGH (ref 0.70–1.25)
GFR, EST AFRICAN AMERICAN: 67 mL/min (ref >=60)
GFR, EST NON AFRICAN AMERICAN: 58 mL/min — AB (ref >=60)
Glucose, Bld: 432 mg/dL — ABNORMAL HIGH (ref 65–99)
Potassium: 3.6 mmol/L (ref 3.5–5.3)
Sodium: 133 mmol/L — ABNORMAL LOW (ref 135–146)
Total Bilirubin: 0.5 mg/dL (ref 0.2–1.2)
Total Protein: 5.3 g/dL — ABNORMAL LOW (ref 6.1–8.1)

## 2016-04-17 MED ORDER — LEVOFLOXACIN 250 MG PO TABS: ORAL_TABLET | ORAL | 0 refills | Status: DC

## 2016-04-17 MED ORDER — SPIRONOLACTONE 25 MG PO TABS: 12.5000 mg | ORAL_TABLET | Freq: Every day | ORAL | 3 refills | Status: AC

## 2016-04-17 NOTE — Patient Instructions (Signed)
Thank you for coming in today. Continue oral fluid pills.  Add spirolactone 1/2 pill daily.  Also start levaquin daily.  Continue albuterol.  Recheck Friday.  Call me if you get worse.  Increase insulin to 25 units daily.   Call or go to the emergency room if you get worse, have trouble breathing, have chest pains, or palpitations.    Pleural Effusion A pleural effusion is an abnormal buildup of fluid in the layers of tissue between your lungs and the inside of your chest (pleural space). These two layers of tissue that line both your lungs and the inside of your chest are called pleura. Usually, there is no air in the space between the pleura, only a thin layer of fluid. If left untreated, a large amount of fluid can build up and cause the lung to collapse. A pleural effusion is usually caused by another disease that requires treatment. The two main types of pleural effusion are:  Transudative pleural effusion. This happens when fluid leaks into the pleural space because of a low protein count in your blood or high blood pressure in your vessels. Heart failure often causes this.  Exudative infusion. This occurs when fluid collects in the pleural space from blocked blood vessels or lymph vessels. Some lung diseases, injuries, and cancers can cause this type of effusion. What are the causes? Pleural effusion can be caused by:  Heart failure.  A blood clot in the lung (pulmonary embolism).  Pneumonia.  Cancer.  Liver failure (cirrhosis).  Kidney disease.  Complications from surgery, such as from open heart surgery. What are the signs or symptoms? In some cases, pleural effusion may cause no symptoms. Symptoms can include:  Shortness of breath, especially when lying down.  Chest pain, often worse when taking a deep breath.  Fever.  Dry cough that is lasting (chronic).  Hiccups.  Rapid breathing. An underlying condition that is causing the pleural effusion (such as heart  failure, pneumonia, blood clots, tuberculosis, or cancer) may also cause additional symptoms. How is this diagnosed? Your health care provider may suspect pleural effusion based on your symptoms and medical history. Your health care provider will also do a physical exam and a chest X-ray. If the X-ray shows there is fluid in your chest, you may need to have this fluid removed using a needle (thoracentesis) so it can be tested. You may also have:  Imaging studies of the chest, such as:  Ultrasound.  CT scan.  Blood tests for kidney and liver function. How is this treated? Treatment depends on the cause of the pleural effusion. Treatment may include:  Taking antibiotic medicines to clear up an infection that is causing the pleural effusion.  Placing a tube in the chest to drain the effusion (tube thoracostomy). This procedure is often used when there is an infection in the fluid.  Surgery to remove the fibrous outer layer of tissue from the pleural space (decortication).  Thoracentesis, which can improve cough and shortness of breath.  A procedure to put medicine into the chest cavity to seal the pleural space to prevent fluid buildup (pleurodesis).  Chemotherapy and radiation therapy. These may be required in the case of cancerous (malignant) pleural effusion. Follow these instructions at home:  Take medicines only as directed by your health care provider.  Keep track of how long you can gently exercise before you get short of breath. Try simply walking at first.  Do not use any tobacco products, including cigarettes, chewing tobacco,  or electronic cigarettes. If you need help quitting, ask your health care provider.  Keep all follow-up visits as directed by your health care provider. This is important. Contact a health care provider if:  The amount of time that you are able to exercise decreases or does not improve with time.  You have pain or signs of infection at the puncture  site if you had thoracentesis. Watch for:  Drainage.  Redness.  Swelling.  You have a fever. Get help right away if:  You are short of breath.  You develop chest pain.  You develop a new cough. This information is not intended to replace advice given to you by your health care provider. Make sure you discuss any questions you have with your health care provider. Document Released: 01/21/2005 Document Revised: 06/26/2015 Document Reviewed: 06/16/2013 Elsevier Interactive Patient Education  2017 [employer]

## 2016-04-17 NOTE — Progress Notes (Signed)
Gabriela EvesClarence Haberl is a 61 y.o. male who [name] today for follow-up leukocytosis and pleural effusion. Patient was seen yesterday for cough. At the time of visit he was thought to have bronchitis possibly pneumonia. A chest x-ray was obtained as an outpatient and non-stat labs were obtained. However both results came back with an expected values. He was found to have a large right pleural effusion and leukocytosis to 25.  However he continues to feel well. He notes the albuterol that was prescribed at last visit seems to working quite well. He notes only minimal shortness of breath and denies fevers or vomiting or diarrhea. He would like to avoid the hospital and feels as though he is doing pretty well. He notes his blood sugars have been elevated recently into the 300s and wonders if he should increase his insulin dose. Additionally he notes his cardiologist stopped spironolactone a few days ago which is about the same time he started feeling bad. He denies significant leg swelling.   Past Medical History:  Diagnosis Date  . Acute on chronic systolic heart failure (HCC) 05/29/2015  . Diabetes mellitus without complication (HCC)   . Hyperlipidemia 01/21/2014   Lipitor started Dec 2015   . Hypertension   . Ischemic cardiomyopathy 10/21/2014   EF 30-35% August 2016, no improvement with medical therapy.  Will be seeing Dr.Preli for possible ICD implantation January 2017    Past Surgical History:  Procedure Laterality Date  . CARDIAC SURGERY    . DENTAL SURGERY    . QUADRICEPS TENDON REPAIR Left 03/16/2012   Procedure: REPAIR QUADRICEP TENDON;  Surgeon: Eldred MangesMark C Yates, MD;  [facility];  Service: Orthopedics;  Laterality: Left;  . TONSILLECTOMY     Social History  Substance Use Topics  . Smoking status: Never Smoker  . Smokeless tobacco: Never Used  . Alcohol  use No   family history is not on file.  ROS as above:  Medications: Current Outpatient Prescriptions  Medication Sig Dispense Refill  . albuterol (PROVENTIL HFA;VENTOLIN HFA) 108 (90 Base) MCG/ACT inhaler Inhale 2 puffs into the lungs every 6 (six) hours as needed for wheezing or shortness of breath. 1 Inhaler 0  . AMBULATORY NON FORMULARY MEDICATION Knee high compression stockings to be worn during all waking hours. Dx: Congestive heart failure. 2 Units 11  . AMBULATORY NON FORMULARY MEDICATION lancets, test strips for 3x daily testing.  Disp QS x1 month E11.39 1 each 12  . aspirin 81 MG tablet Take 81 mg by mouth daily.    Marland Kitchen. atorvastatin (LIPITOR) 40 MG tablet Take by mouth.    . benzonatate (TESSALON) 200 MG capsule Take 1 capsule (200 mg total) by mouth 3 (three) times daily as needed for cough. 45 capsule 0  . bumetanide (BUMEX) 2 MG tablet Take by mouth.    . insulin degludec (TRESIBA FLEXTOUCH) 100 UNIT/ML SOPN FlexTouch Pen Inject 0.2 mLs (20 Units total) into the skin daily at 10 pm. 3 pen 2  . Insulin Glargine (TOUJEO SOLOSTAR) 300 UNIT/ML SOPN Inject 20 Units into the skin daily. 3 pen 2  . Insulin Pen Needle (PEN NEEDLES) 32G X 4 MM MISC 1 each by Does not apply route daily. 100 each 1  . ipratropium (ATROVENT) 0.06 % nasal spray Place 2 sprays into both nostrils every 4 (four) hours as needed for rhinitis. 10 mL 6  . KRILL OIL PO Take 350 mg  by mouth.    . losartan (COZAAR) 25 MG tablet TAKE 1 TABLET (25 MG TOTAL) BY MOUTH DAILY. 90 tablet 0  . Lutein 6 MG CAPS Take by mouth.    . metoprolol succinate (TOPROL-XL) 25 MG 24 hr tablet TAKE 1 TABLET (25 MG TOTAL) BY MOUTH DAILY. 90 tablet 0  . multivitamin-lutein (OCUVITE-LUTEIN) CAPS Take 1 capsule by mouth daily.    . ONGLYZA 5 MG TABS tablet TAKE 1 TABLET (5 MG TOTAL) BY MOUTH DAILY. 90 tablet 0  . potassium chloride SA (K-DUR,KLOR-CON) 20 MEQ tablet Take 1 tablet (20 mEq total) by mouth 3 (three) times daily. 90 tablet 1  .  levofloxacin (LEVAQUIN) 250 MG tablet 2 pill po day 1 then 1 pill po days 2-10 11 tablet 0  . spironolactone (ALDACTONE) 25 MG tablet Take 0.5 tablets (12.5 mg total) by mouth daily. 30 tablet 3   No current facility-administered medications for this visit.    Allergies  Allergen Reactions  . Lisinopril     SOB    Health Maintenance Health Maintenance  Topic Date Due  . COLONOSCOPY  02/03/2029 (Originally 12/08/2004)  . HIV Screening  02/03/2029 (Originally 12/08/1970)  . OPHTHALMOLOGY EXAM  04/27/2016  . HEMOGLOBIN A1C  07/12/2016  . FOOT EXAM  10/31/2016  . PNEUMOCOCCAL POLYSACCHARIDE VACCINE (2) 10/31/2020  . TETANUS/TDAP  10/31/2025  . INFLUENZA VACCINE  Completed  . Hepatitis C Screening  Completed     Exam:  BP 96/68   Pulse (!) 132   Temp 98.3 F (36.8 C) (Oral)   Wt 161 lb (73 kg)   SpO2 95%   BMI 23.10 kg/m  Gen: Well NAD Nontoxic appearing HEENT: EOMI,  MMM Lungs: Normal work of breathing. Decreased breath sounds right lung field coarse breath sounds left. Heart: Tachycardia but regular no MRG Abd: NABS, Soft. Nondistended, Nontender Exts: Brisk capillary refill, warm and well perfused.    Results for orders placed or performed in visit on 04/16/16 (from the past 72 hour(s))  CBC     Status: Abnormal   Collection Time: 04/16/16  4:19 PM  Result Value Ref Range   WBC 25.2 (H) 3.8 - 10.8 K/uL   RBC 4.02 (L) 4.20 - 5.80 MIL/uL   Hemoglobin 10.6 (L) 13.2 - 17.1 g/dL   HCT 16.1 (L) 09.6 - 04.5 %   MCV 84.8 80.0 - 100.0 fL   MCH 26.4 (L) 27.0 - 33.0 pg   MCHC 31.1 (L) 32.0 - 36.0 g/dL   RDW 40.9 (H) 81.1 - 91.4 %   Platelets 470 (H) 140 - 400 K/uL   MPV 11.3 7.5 - 12.5 fL  COMPLETE METABOLIC PANEL WITH GFR     Status: Abnormal   Collection Time: 04/16/16  4:19 PM  Result Value Ref Range   Sodium 133 (L) 135 - 146 mmol/L   Potassium 3.6 3.5 - 5.3 mmol/L   Chloride 90 (L) 98 - 110 mmol/L   CO2 26 20 - 31 mmol/L   Glucose, Bld 432 (H) 65 - 99 mg/dL     Comment: Result repeated and verified.   BUN 37 (H) 7 - 25 mg/dL   Creat 7.82 (H) 9.56 - 1.25 mg/dL    Comment:   For patients > or = 62 years of age: The upper reference limit for Creatinine is approximately 13% higher for people identified as African-American.      Total Bilirubin 0.5 0.2 - 1.2 mg/dL   Alkaline Phosphatase 88 40 - 115  U/L   AST 19 10 - 35 U/L   ALT 13 9 - 46 U/L   Total Protein 5.3 (L) 6.1 - 8.1 g/dL   Albumin 2.7 (L) 3.6 - 5.1 g/dL   Calcium 8.2 (L) 8.6 - 10.3 mg/dL   GFR, Est African American 67 >=60 mL/min   GFR, Est Non African American 58 (L) >=60 mL/min   Dg Chest 2 View  Result Date: 04/16/2016 CLINICAL DATA:  61 y/o  M; 1 week of cough. EXAM: CHEST  2 VIEW COMPARISON:  03/01/2016 chest radiograph. FINDINGS: Large right pleural effusion with leftward mediastinal shift. Atelectasis of the right middle and lower lobes. Coronary stents. Clear left lung. Mild degenerative changes of the spine. Stable cardiac silhouette. IMPRESSION: Interval development of a large right pleural effusion with atelectasis of right middle and lower lobes. Electronically Signed   By: Mitzi Hansen M.D.   On: 04/16/2016 17:11      Assessment and Plan: 61 y.o. male with Pleural effusion and leukocytosis. I suspect the pleural effusion is likely combination of heart failure related and possibly parapneumonic effusion. The most important factor here is that Mr. Buelow feels pretty well and looks pretty good. His vitals are not much change from how they have been at previous visits. He always has been tachycardic over the last several months and mildly hypotensive. Plan to add back 12-1/2 mg of spironolactone daily and start levofloxacin for presumed pneumonia based on leukocytosis. Additionally we'll increase insulin to 25 units and recheck in 2 days. Hopefully with improved diuresis and control of potential pneumonia he will continue to feel well and his effusion and leukocytosis  will both diminished. However we had a long talk about safety. He understands that the outpatient plan is probably not the safest approach if he would like to very much avoid the hospital if possible. Additionally we discussed potential for hospice or palliative care in the future if needed. We discussed that he does not need to go to the hospital ever if he doesn't want to. We can always transition to a more palliative approach. He will keep me informed and we will recheck in 1-2 days. Plan to recheck x-ray and labs at that visit.   No orders of the defined types were placed in this encounter.  Meds ordered this encounter  Medications  . spironolactone (ALDACTONE) 25 MG tablet    Sig: Take 0.5 tablets (12.5 mg total) by mouth daily.    Dispense:  30 tablet    Refill:  3  . levofloxacin (LEVAQUIN) 250 MG tablet    Sig: 2 pill po day 1 then 1 pill po days 2-10    Dispense:  11 tablet    Refill:  0     Discussed warning signs or symptoms. Please see discharge instructions. Patient expresses understanding.   I spent 40 minutes with this patient, greater than 50% was face-to-face time counseling regarding the above diagnosis.

## 2016-04-19 ENCOUNTER — Encounter: Payer: Self-pay | Admitting: Family Medicine

## 2016-04-19 ENCOUNTER — Ambulatory Visit (INDEPENDENT_AMBULATORY_CARE_PROVIDER_SITE_OTHER): Payer: BLUE CROSS/BLUE SHIELD | Admitting: Family Medicine

## 2016-04-19 ENCOUNTER — Ambulatory Visit (INDEPENDENT_AMBULATORY_CARE_PROVIDER_SITE_OTHER): Payer: BLUE CROSS/BLUE SHIELD

## 2016-04-19 VITALS — BP 92/61 | HR 127 | Temp 98.2°F | Wt 159.0 lb

## 2016-04-19 DIAGNOSIS — D72829 Elevated white blood cell count, unspecified: Secondary | ICD-10-CM

## 2016-04-19 DIAGNOSIS — J90 Pleural effusion, not elsewhere classified: Secondary | ICD-10-CM | POA: Diagnosis not present

## 2016-04-19 LAB — CBC WITH DIFFERENTIAL/PLATELET
BASOS PCT: 0 %
Basophils Absolute: 0 cells/uL (ref 0–200)
EOS ABS: 0 {cells}/uL — AB (ref 15–500)
EOS PCT: 0 %
HCT: 32.7 % — ABNORMAL LOW (ref 38.5–50.0)
Hemoglobin: 11.1 g/dL — ABNORMAL LOW (ref 13.2–17.1)
LYMPHS PCT: 6 %
Lymphs Abs: 1326 cells/uL (ref 850–3900)
MCH: 26.4 pg — ABNORMAL LOW (ref 27.0–33.0)
MCHC: 33.9 g/dL (ref 32.0–36.0)
MCV: 77.9 fL — ABNORMAL LOW (ref 80.0–100.0)
MONOS PCT: 7 %
MPV: 10.6 fL (ref 7.5–12.5)
Monocytes Absolute: 1547 cells/uL — ABNORMAL HIGH (ref 200–950)
NEUTROS ABS: 19227 {cells}/uL — AB (ref 1500–7800)
Neutrophils Relative %: 87 %
PLATELETS: 462 10*3/uL — AB (ref 140–400)
RBC: 4.2 MIL/uL (ref 4.20–5.80)
RDW: 17.2 % — AB (ref 11.0–15.0)
WBC: 22.1 10*3/uL — AB (ref 3.8–10.8)

## 2016-04-19 LAB — COMPLETE METABOLIC PANEL WITH GFR
ALT: 12 U/L (ref 9–46)
AST: 15 U/L (ref 10–35)
Albumin: 2.9 g/dL — ABNORMAL LOW (ref 3.6–5.1)
Alkaline Phosphatase: 76 U/L (ref 40–115)
BUN: 32 mg/dL — ABNORMAL HIGH (ref 7–25)
CHLORIDE: 86 mmol/L — AB (ref 98–110)
CO2: 33 mmol/L — ABNORMAL HIGH (ref 20–31)
CREATININE: 1.15 mg/dL (ref 0.70–1.25)
Calcium: 9.1 mg/dL (ref 8.6–10.3)
GFR, Est African American: 79 mL/min (ref >=60)
GFR, Est Non African American: 68 mL/min (ref >=60)
GLUCOSE: 223 mg/dL — AB (ref 65–99)
Potassium: 3.2 mmol/L — ABNORMAL LOW (ref 3.5–5.3)
SODIUM: 134 mmol/L — AB (ref 135–146)
TOTAL PROTEIN: 5.4 g/dL — AB (ref 6.1–8.1)
Total Bilirubin: 0.4 mg/dL (ref 0.2–1.2)

## 2016-04-19 NOTE — Progress Notes (Signed)
Peter Griffin is a 61 y.o. male who [facility] today for cough and pleural effusion. Patient was seen twice this week for cough since it was found to have pleural effusion and leukocytosis. This was thought to be due to either heart failure or parapneumonic effusion. He's been treated with Levaquin, continue diuretic and adding spironolactone, and albuterol. He notes is feeling a bit better. He denies chest pain. He notes his shortness of breath on exertion is about stable. He would like to avoid the hospital with possible.   Past Medical History:  Diagnosis Date  . Acute on chronic systolic heart failure (HCC) 05/29/2015  . Diabetes mellitus without complication (HCC)   . Hyperlipidemia 01/21/2014   Lipitor started Dec 2015   . Hypertension   . Ischemic cardiomyopathy 10/21/2014   EF 30-35% August 2016, no improvement with medical therapy.  Will be seeing Dr.Preli for possible ICD implantation January 2017    Past Surgical History:  Procedure Laterality Date  . CARDIAC SURGERY    . DENTAL SURGERY    . QUADRICEPS TENDON REPAIR Left 03/16/2012   Procedure: REPAIR QUADRICEP TENDON;  Surgeon: Eldred MangesMark C Yates, MD;  [facility];  Service: Orthopedics;  Laterality: Left;  . TONSILLECTOMY     Social History  Substance Use Topics  . Smoking status: Never Smoker  . Smokeless tobacco: Never Used  . Alcohol use No   family history is not on file.  ROS as above:  Medications: Current Outpatient Prescriptions  Medication Sig Dispense Refill  . albuterol (PROVENTIL HFA;VENTOLIN HFA) 108 (90 Base) MCG/ACT inhaler Inhale 2 puffs into the lungs every 6 (six) hours as needed for wheezing or shortness of breath. 1 Inhaler 0  . AMBULATORY NON FORMULARY MEDICATION Knee high compression stockings to be worn during all waking hours. Dx: Congestive heart failure. 2  Units 11  . AMBULATORY NON FORMULARY MEDICATION lancets, test strips for 3x daily testing.  Disp QS x1 month E11.39 1 each 12  . aspirin 81 MG tablet Take 81 mg by mouth daily.    Marland Kitchen. atorvastatin (LIPITOR) 40 MG tablet Take by mouth.    . benzonatate (TESSALON) 200 MG capsule Take 1 capsule (200 mg total) by mouth 3 (three) times daily as needed for cough. 45 capsule 0  . bumetanide (BUMEX) 2 MG tablet Take by mouth.    . insulin degludec (TRESIBA FLEXTOUCH) 100 UNIT/ML SOPN FlexTouch Pen Inject 0.2 mLs (20 Units total) into the skin daily at 10 pm. 3 pen 2  . Insulin Glargine (TOUJEO SOLOSTAR) 300 UNIT/ML SOPN Inject 20 Units into the skin daily. 3 pen 2  . Insulin Pen Needle (PEN NEEDLES) 32G X 4 MM MISC 1 each by Does not apply route daily. 100 each 1  . ipratropium (ATROVENT) 0.06 % nasal spray Place 2 sprays into both nostrils every 4 (four) hours as needed for rhinitis. 10 mL 6  . KRILL OIL PO Take 350 mg by mouth.    . levofloxacin (LEVAQUIN) 250 MG tablet 2 pill po day 1 then 1 pill po days 2-10 11 tablet 0  . losartan (COZAAR) 25 MG tablet TAKE 1 TABLET (25 MG TOTAL) BY MOUTH DAILY. 90 tablet 0  . Lutein 6 MG CAPS Take by mouth.    . metoprolol succinate (TOPROL-XL) 25 MG 24 hr tablet TAKE 1 TABLET (25 MG TOTAL) BY MOUTH DAILY. 90 tablet 0  . multivitamin-lutein (OCUVITE-LUTEIN)  CAPS Take 1 capsule by mouth daily.    . ONGLYZA 5 MG TABS tablet TAKE 1 TABLET (5 MG TOTAL) BY MOUTH DAILY. 90 tablet 0  . potassium chloride SA (K-DUR,KLOR-CON) 20 MEQ tablet Take 1 tablet (20 mEq total) by mouth 3 (three) times daily. 90 tablet 1  . spironolactone (ALDACTONE) 25 MG tablet Take 0.5 tablets (12.5 mg total) by mouth daily. 30 tablet 3   No current facility-administered medications for this visit.    Allergies  Allergen Reactions  . Lisinopril     SOB    Health Maintenance Health Maintenance  Topic Date Due  . COLONOSCOPY  02/03/2029 (Originally 12/08/2004)  . HIV Screening  02/03/2029  (Originally 12/08/1970)  . OPHTHALMOLOGY EXAM  04/27/2016  . HEMOGLOBIN A1C  07/12/2016  . FOOT EXAM  10/31/2016  . PNEUMOCOCCAL POLYSACCHARIDE VACCINE (2) 10/31/2020  . TETANUS/TDAP  10/31/2025  . INFLUENZA VACCINE  Completed  . Hepatitis C Screening  Completed     Exam:  BP 92/61   Pulse (!) 127   Temp 98.2 F (36.8 C) (Oral)   Wt 159 lb (72.1 kg)   SpO2 97%   BMI 22.81 kg/m   Wt Readings from Last 10 Encounters:  04/19/16 159 lb (72.1 kg)  04/17/16 161 lb (73 kg)  04/16/16 162 lb (73.5 kg)  03/27/16 158 lb (71.7 kg)  03/13/16 169 lb (76.7 kg)  03/07/16 166 lb (75.3 kg)  03/04/16 162 lb (73.5 kg)  03/01/16 157 lb (71.2 kg)  02/29/16 156 lb (70.8 kg)  02/27/16 160 lb (72.6 kg)    Gen: Well NAD nontoxic appearing HEENT: EOMI,  MMM Lungs: Normal work of breathing. Decreased breath sounds right lung Heart: Cardiac regular no MRG Abd: NABS, Soft. Nondistended, Nontender Exts: Brisk capillary refill, warm and well perfused. No edema    Dg Chest 2 View  Result Date: 04/19/2016 CLINICAL DATA:  Cough for 1 week EXAM: CHEST  2 VIEW COMPARISON:  04/16/2016 FINDINGS: Large right pleural effusion is again identified with right basilar atelectasis. Left lung is clear. Cardiac shadow is stable. IMPRESSION: Stable large right pleural effusion. Electronically Signed   By: Alcide Clever M.D.   On: 04/19/2016 11:14   Dg Chest 2 View  Result Date: 04/16/2016 CLINICAL DATA:  61 y/o  M; 1 week of cough. EXAM: CHEST  2 VIEW COMPARISON:  03/01/2016 chest radiograph. FINDINGS: Large right pleural effusion with leftward mediastinal shift. Atelectasis of the right middle and lower lobes. Coronary stents. Clear left lung. Mild degenerative changes of the spine. Stable cardiac silhouette. IMPRESSION: Interval development of a large right pleural effusion with atelectasis of right middle and lower lobes. Electronically Signed   By: Mitzi Hansen M.D.   On: 04/16/2016 17:11       Assessment and Plan: 61 y.o. male with stable pleural effusion. Stat CBC and metabolic panel pending. Plan to continue current regimen with Levaquin and increased diuretic dosing. Recheck Monday. Discussed safety plan. Like to avoid the hospitalist possible.   Orders Placed This Encounter  Procedures  . DG Chest 2 View    Order Specific Question:   Reason for exam:    Answer:   f/u pleural effusion    Order Specific Question:   Preferred imaging location?    Answer:   [facility]  . CBC with Differential/Platelet  . COMPLETE METABOLIC PANEL WITH GFR   No orders of the defined types were placed in this encounter.    Discussed warning signs or  symptoms. Please see discharge instructions. Patient expresses understanding.

## 2016-04-19 NOTE — Patient Instructions (Signed)
Thank you for coming in today. Get labs today.  Recheck Monday or Tuesday.  Let me know over the weekend if you get worse.  I would like to know even if you are going to the ER so I can call ahead and give a heads up to the emergency room doctor.  [phone] Call or go to the emergency room if you get worse, have trouble breathing, have chest pains, or palpitations.

## 2016-04-22 ENCOUNTER — Ambulatory Visit (INDEPENDENT_AMBULATORY_CARE_PROVIDER_SITE_OTHER): Payer: BLUE CROSS/BLUE SHIELD | Admitting: Family Medicine

## 2016-04-22 ENCOUNTER — Ambulatory Visit (INDEPENDENT_AMBULATORY_CARE_PROVIDER_SITE_OTHER): Payer: BLUE CROSS/BLUE SHIELD

## 2016-04-22 ENCOUNTER — Encounter: Payer: Self-pay | Admitting: Family Medicine

## 2016-04-22 VITALS — BP 84/50 | HR 125 | Wt 159.0 lb

## 2016-04-22 DIAGNOSIS — D72829 Elevated white blood cell count, unspecified: Secondary | ICD-10-CM | POA: Diagnosis not present

## 2016-04-22 DIAGNOSIS — J90 Pleural effusion, not elsewhere classified: Secondary | ICD-10-CM

## 2016-04-22 LAB — COMPLETE METABOLIC PANEL WITH GFR
ALBUMIN: 3 g/dL — AB (ref 3.6–5.1)
ALK PHOS: 88 U/L (ref 40–115)
ALT: 13 U/L (ref 9–46)
AST: 17 U/L (ref 10–35)
BUN: 38 mg/dL — ABNORMAL HIGH (ref 7–25)
CHLORIDE: 87 mmol/L — AB (ref 98–110)
CO2: 29 mmol/L (ref 20–31)
Calcium: 9.3 mg/dL (ref 8.6–10.3)
Creat: 1.1 mg/dL (ref 0.70–1.25)
GFR, EST NON AFRICAN AMERICAN: 72 mL/min (ref >=60)
GFR, Est African American: 83 mL/min (ref >=60)
Glucose, Bld: 161 mg/dL — ABNORMAL HIGH (ref 65–99)
POTASSIUM: 3.5 mmol/L (ref 3.5–5.3)
Sodium: 135 mmol/L (ref 135–146)
Total Bilirubin: 0.5 mg/dL (ref 0.2–1.2)
Total Protein: 5.7 g/dL — ABNORMAL LOW (ref 6.1–8.1)

## 2016-04-22 LAB — CBC
HEMATOCRIT: 36.3 % — AB (ref 38.5–50.0)
HEMOGLOBIN: 12 g/dL — AB (ref 13.2–17.1)
MCH: 26.6 pg — AB (ref 27.0–33.0)
MCHC: 33.1 g/dL (ref 32.0–36.0)
MCV: 80.5 fL (ref 80.0–100.0)
MPV: 10.9 fL (ref 7.5–12.5)
Platelets: 492 10*3/uL — ABNORMAL HIGH (ref 140–400)
RBC: 4.51 MIL/uL (ref 4.20–5.80)
RDW: 15.4 % — AB (ref 11.0–15.0)
WBC: 19.9 10*3/uL — AB (ref 3.8–10.8)

## 2016-04-22 NOTE — Progress Notes (Signed)
Peter Griffin is a 61 y.o. male who presents to [name] today for follow-up pleural effusion. Patient has been seen several times last week for cough and subsequently discover pleural effusion and leukocytosis. He's been treated with more aggressive diuresis including Bumex, Zaroxolyn, spironolactone.  Additionally his leukocytosis was thought to be possible pneumonia and was treated with Levaquin.  He notes he continues to feel about the same/slightly worse. He denies fevers or frank shortness of breath with rest. He denies vomiting or diarrhea. He does not feel any better however.   Past Medical History:  Diagnosis Date  . Acute on chronic systolic heart failure (HCC) 05/29/2015  . Diabetes mellitus without complication (HCC)   . Hyperlipidemia 01/21/2014   Lipitor started Dec 2015   . Hypertension   . Ischemic cardiomyopathy 10/21/2014   EF 30-35% August 2016, no improvement with medical therapy.  Will be seeing Dr.Preli for possible ICD implantation January 2017    Past Surgical History:  Procedure Laterality Date  . CARDIAC SURGERY    . DENTAL SURGERY    . QUADRICEPS TENDON REPAIR Left 03/16/2012   Procedure: REPAIR QUADRICEP TENDON;  Surgeon: Eldred Manges, MD;  Location: [facility] OR;  Service: Orthopedics;  Laterality: Left;  . TONSILLECTOMY     Social History  Substance Use Topics  . Smoking status: Never Smoker  . Smokeless tobacco: Never Used  . Alcohol use No   family history is not on file.  ROS as above:  Medications: Current Outpatient Prescriptions  Medication Sig Dispense Refill  . albuterol (PROVENTIL HFA;VENTOLIN HFA) 108 (90 Base) MCG/ACT inhaler Inhale 2 puffs into the lungs every 6 (six) hours as needed for wheezing or shortness of breath. 1 Inhaler 0  . AMBULATORY NON FORMULARY MEDICATION Knee high compression stockings to be worn during  all waking hours. Dx: Congestive heart failure. 2 Units 11  . AMBULATORY NON FORMULARY MEDICATION lancets, test strips for 3x daily testing.  Disp QS x1 month E11.39 1 each 12  . aspirin 81 MG tablet Take 81 mg by mouth daily.    Marland Kitchen atorvastatin (LIPITOR) 40 MG tablet Take by mouth.    . benzonatate (TESSALON) 200 MG capsule Take 1 capsule (200 mg total) by mouth 3 (three) times daily as needed for cough. 45 capsule 0  . bumetanide (BUMEX) 2 MG tablet Take by mouth.    . insulin degludec (TRESIBA FLEXTOUCH) 100 UNIT/ML SOPN FlexTouch Pen Inject 0.2 mLs (20 Units total) into the skin daily at 10 pm. 3 pen 2  . Insulin Glargine (TOUJEO SOLOSTAR) 300 UNIT/ML SOPN Inject 20 Units into the skin daily. 3 pen 2  . Insulin Pen Needle (PEN NEEDLES) 32G X 4 MM MISC 1 each by Does not apply route daily. 100 each 1  . ipratropium (ATROVENT) 0.06 % nasal spray Place 2 sprays into both nostrils every 4 (four) hours as needed for rhinitis. 10 mL 6  . KRILL OIL PO Take 350 mg by mouth.    . levofloxacin (LEVAQUIN) 250 MG tablet 2 pill po day 1 then 1 pill po days 2-10 11 tablet 0  . losartan (COZAAR) 25 MG tablet TAKE 1 TABLET (25 MG TOTAL) BY MOUTH DAILY. 90 tablet 0  . Lutein 6 MG CAPS Take by mouth.    . metoprolol succinate (TOPROL-XL) 25 MG 24 hr tablet TAKE 1 TABLET (25 MG TOTAL) BY MOUTH DAILY. 90 tablet 0  . multivitamin-lutein (  OCUVITE-LUTEIN) CAPS Take 1 capsule by mouth daily.    . ONGLYZA 5 MG TABS tablet TAKE 1 TABLET (5 MG TOTAL) BY MOUTH DAILY. 90 tablet 0  . potassium chloride SA (K-DUR,KLOR-CON) 20 MEQ tablet Take 1 tablet (20 mEq total) by mouth 3 (three) times daily. 90 tablet 1  . spironolactone (ALDACTONE) 25 MG tablet Take 0.5 tablets (12.5 mg total) by mouth daily. 30 tablet 3   No current facility-administered medications for this visit.    Allergies  Allergen Reactions  . Lisinopril     SOB    Health Maintenance Health Maintenance  Topic Date Due  . COLONOSCOPY  02/03/2029  (Originally 12/08/2004)  . HIV Screening  02/03/2029 (Originally 12/08/1970)  . OPHTHALMOLOGY EXAM  04/27/2016  . HEMOGLOBIN A1C  07/12/2016  . FOOT EXAM  10/31/2016  . PNEUMOCOCCAL POLYSACCHARIDE VACCINE (2) 10/31/2020  . TETANUS/TDAP  10/31/2025  . INFLUENZA VACCINE  Completed  . Hepatitis C Screening  Completed     Exam:  BP (!) 84/50   Pulse (!) 125   Wt 159 lb (72.1 kg)   SpO2 96%   BMI 22.81 kg/m  Gen: Well NAD HEENT: EOMI,  MMM Lungs: Normal work of breathing. Continued poor breath sounds right lower lung coarse breath sounds left Heart: RRR no MRG Abd: NABS, Soft. Nondistended, Nontender Exts: Brisk capillary refill, warm and well perfused.    Chest x-ray preliminary findings show continue stable right pleural effusion with no obvious pneumonia. Appears unchanged. Awaiting formal radiology review   Assessment and Plan: 61 y.o. male with persistent pleural effusion despite aggressive oral diuresis. Weight appears to be stable. I'm essentially running out of treatment options at this time. I think hospitalization is probably the best bed. Plan to discuss with his cardiologist as well as trying to arrange for direct admission to the hospital. If we are unable to arrange for direct admit well send to the emergency room for further evaluation and admission.  Will contact patient later with plan.  Orders Placed This Encounter  Procedures  . DG Chest 2 View    Order Specific Question:   Reason for exam:    Answer:   follow pleural effusion    Order Specific Question:   Preferred imaging location?    [facility]  . CBC  . COMPLETE METABOLIC PANEL WITH GFR   No orders of the defined types were placed in this encounter.     Discussed warning signs or symptoms. Please see discharge instructions. Patient expresses understanding.

## 2016-04-22 NOTE — Patient Instructions (Signed)
Thank you for coming in today. Repeat labs and xray today.  Call or go to the emergency room if you get worse, have trouble breathing, have chest pains, or palpitations.   We are figuring out if we need to get you to a hospital in the near future.  Call or go to the emergency room if you get worse, have trouble breathing, have chest pains, or palpitations.

## 2016-04-24 ENCOUNTER — Ambulatory Visit: Payer: Self-pay | Admitting: Family Medicine

## 2016-05-05 DEATH — deceased

## 2016-05-22 ENCOUNTER — Other Ambulatory Visit: Payer: Self-pay | Admitting: *Deleted

## 2016-05-22 ENCOUNTER — Other Ambulatory Visit: Payer: Self-pay | Admitting: Family Medicine

## 2016-05-22 NOTE — Telephone Encounter (Signed)
I do not see Metformin on pt's current med list.  There is Toujeo & Onglyza only.  Please advise.

## 2016-05-24 ENCOUNTER — Ambulatory Visit: Payer: BLUE CROSS/BLUE SHIELD | Admitting: Family Medicine

## 2016-06-04 DEATH — deceased

## 2017-07-22 IMAGING — CR DG ABDOMEN ACUTE W/ 1V CHEST
4 series · 4 of 4 positions shown · non-contrast
Comparison: PA and lateral chest x-ray January 20, 2015

CLINICAL DATA: Right upper quadrant discomfort for the past day;
history of diabetes and hypertension, ischemic cardiomyopathy and
history of STEMI

EXAM:
DG ABDOMEN ACUTE W/ 1V CHEST

[chest pa]
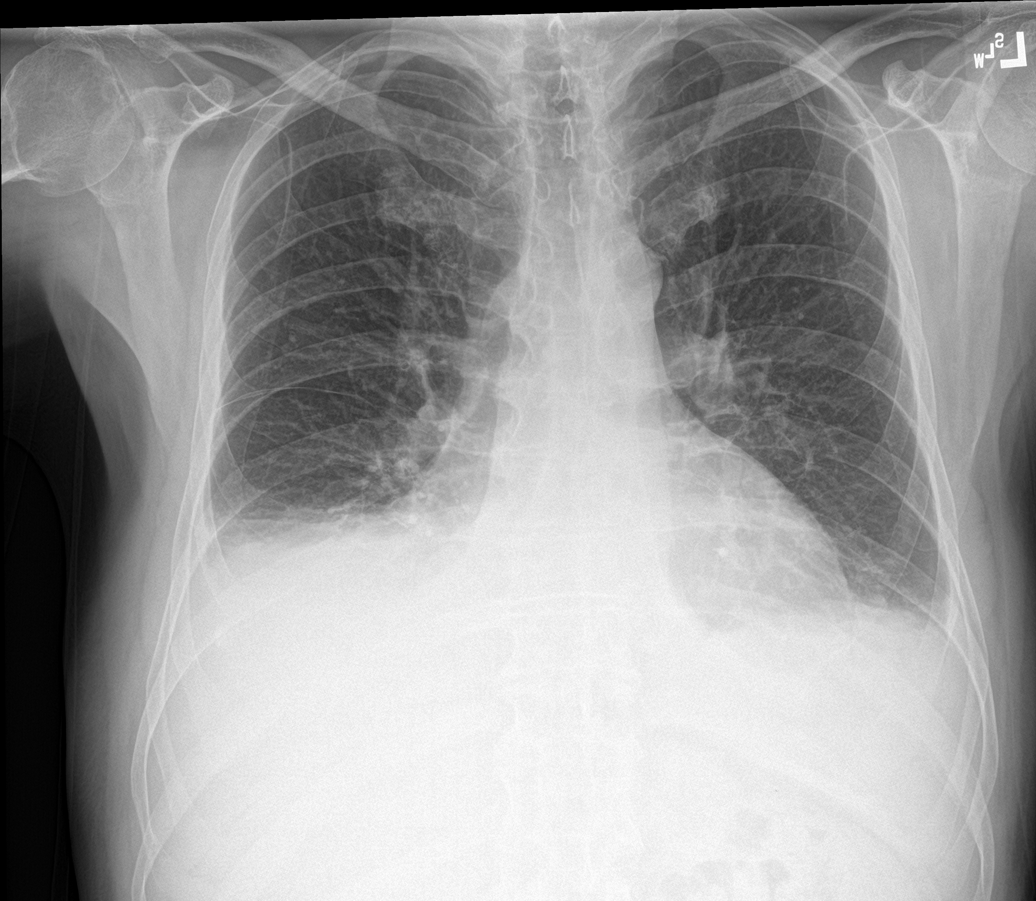

[abdomen erect]
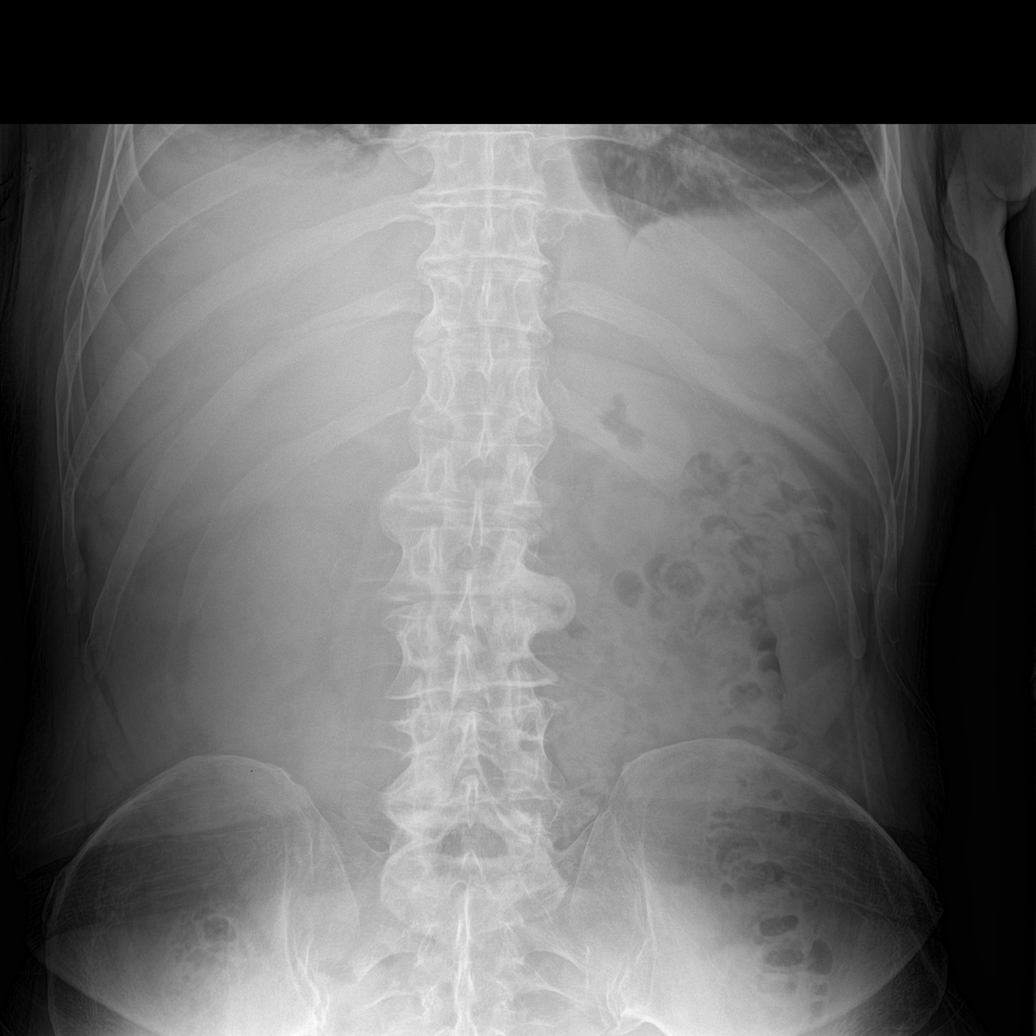

[abdomen supine (1 of 2)]
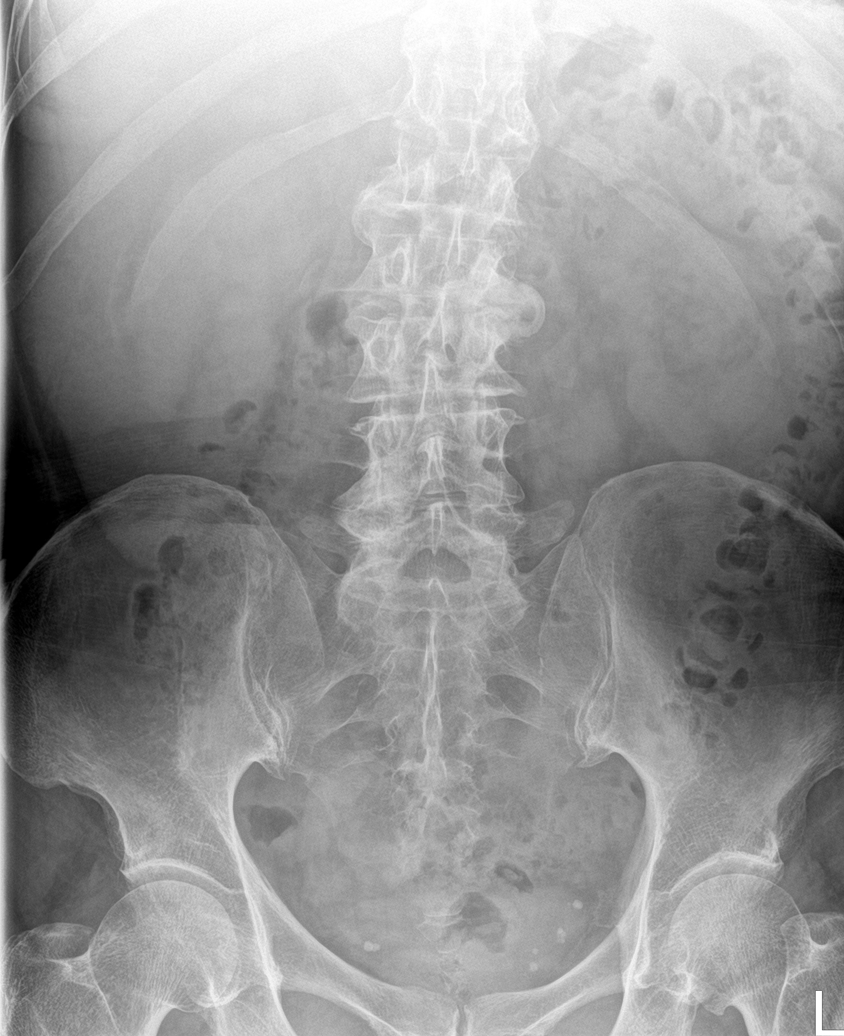

[abdomen supine (2 of 2)]
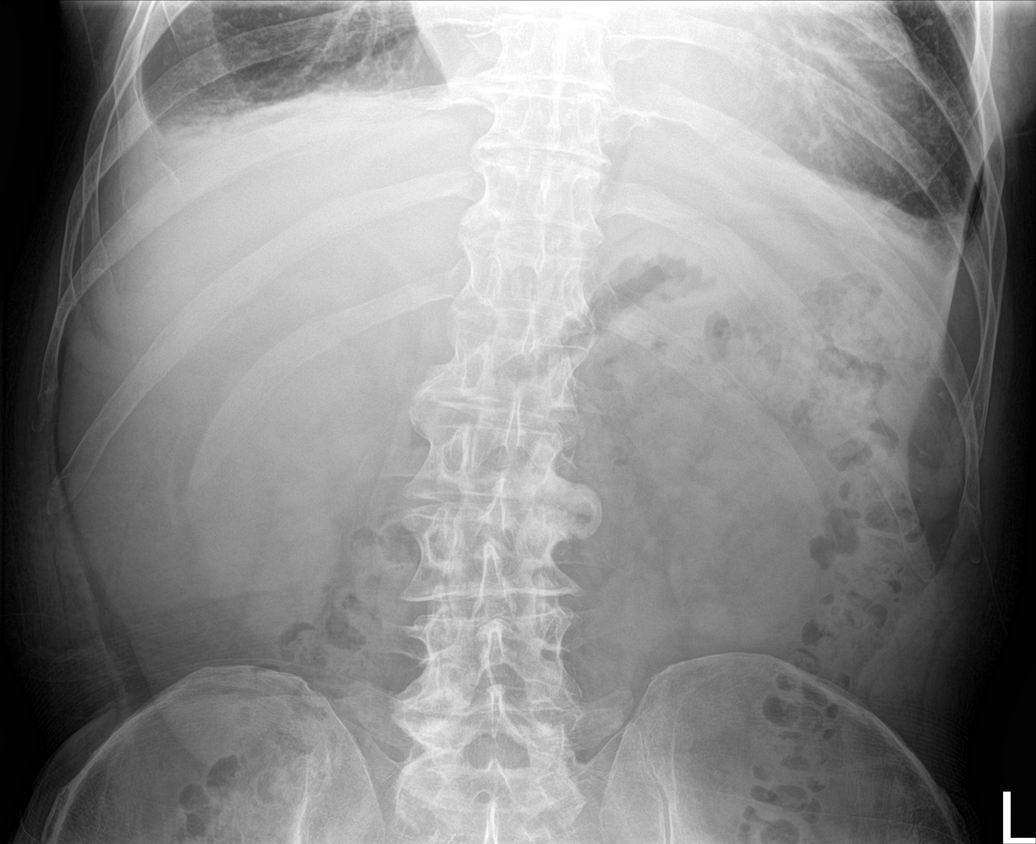

[4 of 4 positions shown; findings below may reference images not displayed]

FINDINGS: There are new small bilateral pleural effusions. There is bibasilar
subsegmental atelectasis on the right and minimal similar findings
on the left. The cardiac silhouette is mildly enlarged. The
pulmonary vascularity is not engorged.

Within the abdomen the stool and gas pattern is within the limits of
normal. There is subjective mild enlargement of the liver. There are
degenerative changes of the lumbar spine. There are phleboliths
within the pelvis.
IMPRESSION: 1. New small bilateral pleural effusions with bibasilar atelectasis
slightly greater on the right than on the left. Mild enlargement of
the cardiac silhouette without significant pulmonary vascular
congestion.
2. Mild prominence of the silhouette of the liver. No abnormal soft
tissue calcifications.
3. Given the right upper quadrant discomfort, further evaluation of
the gallbladder with right upper quadrant ultrasound may be useful
to exclude gallstones and nor acute cholecystitis.

## 2017-09-17 IMAGING — CR DG CHEST 2V
2 series · 2 of 2 positions shown · non-contrast
Comparison: Acute abdominal series of March 21, 2015 and PA and
lateral chest x-ray January 20, 2015

CLINICAL DATA: Four days of shortness of breath.

EXAM:
CHEST  2 VIEW

[chest pa]
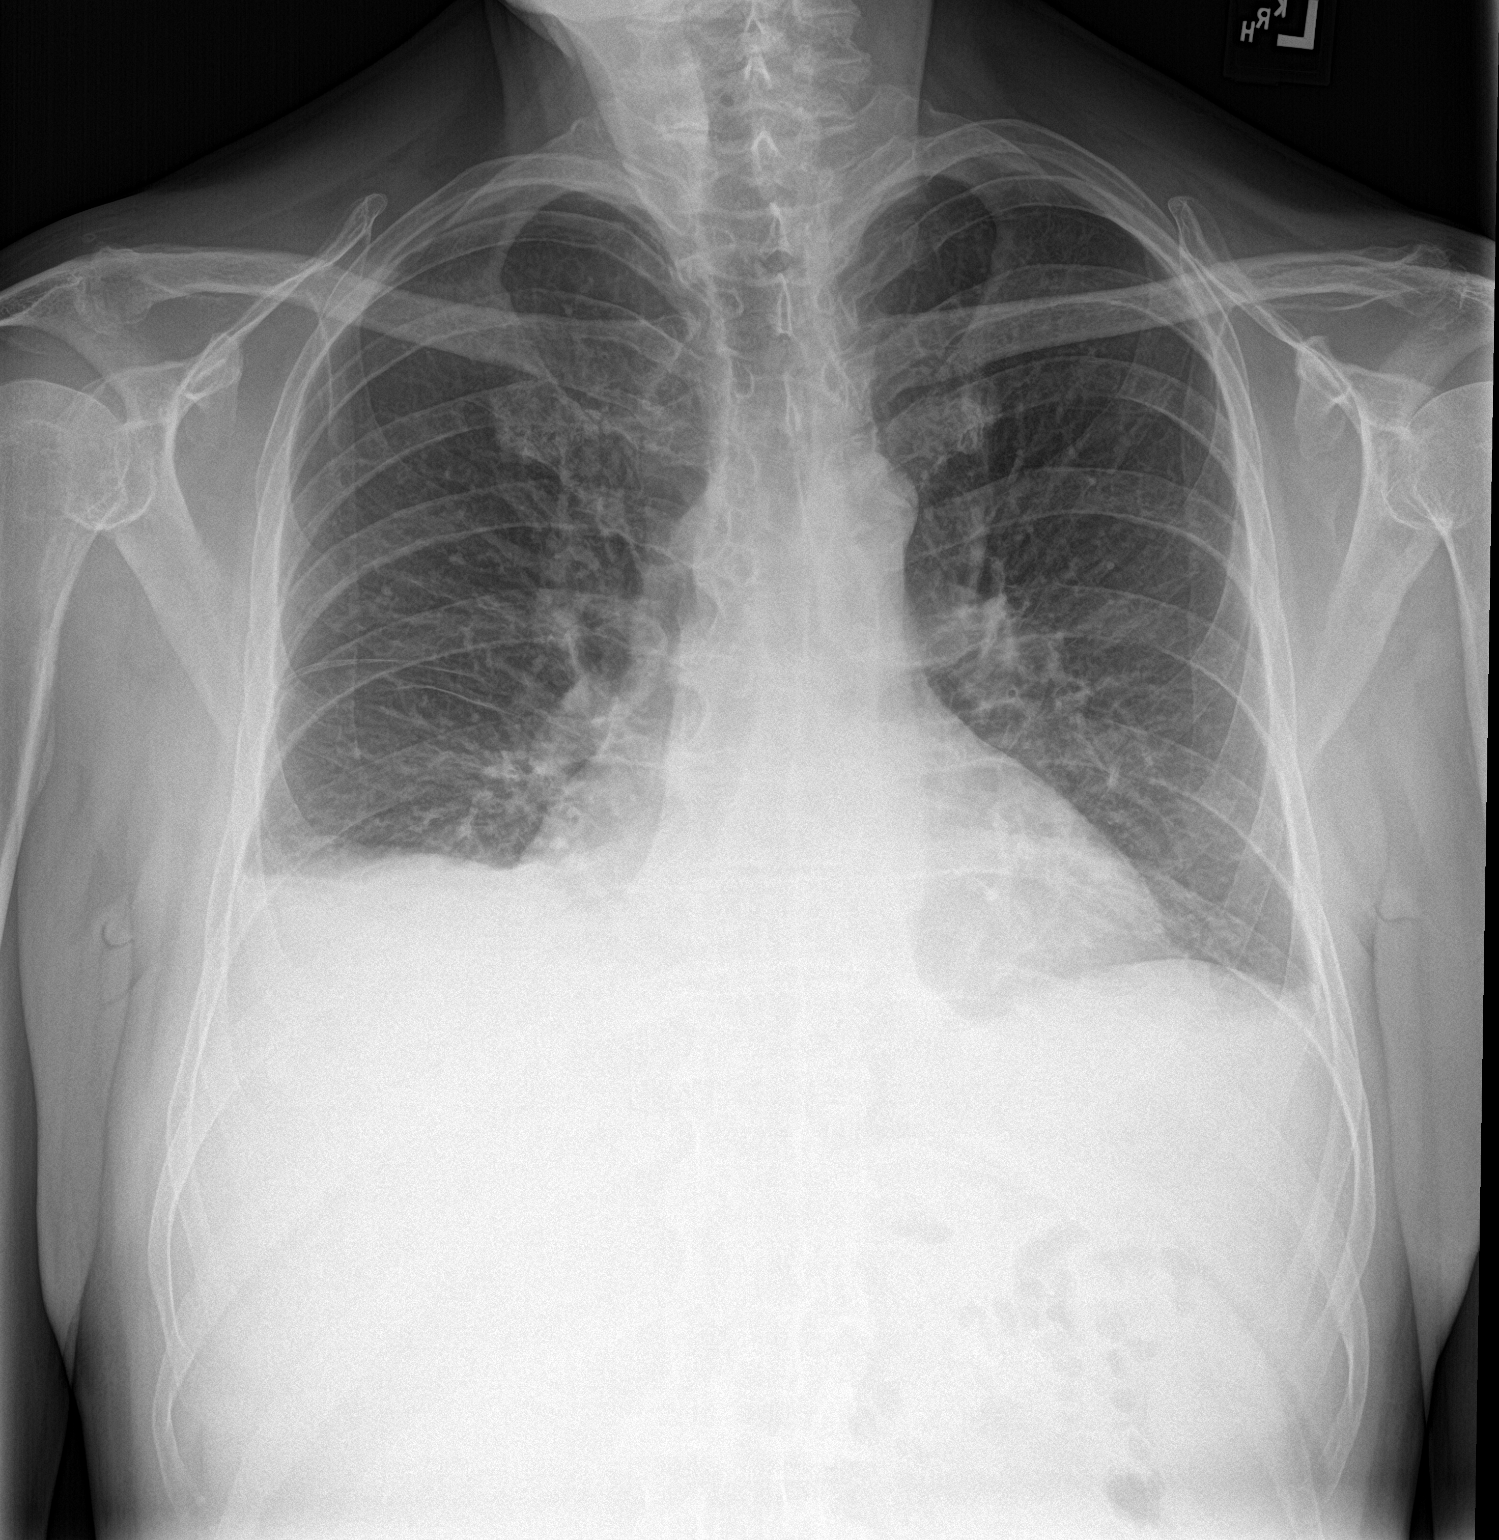

[chest lat]
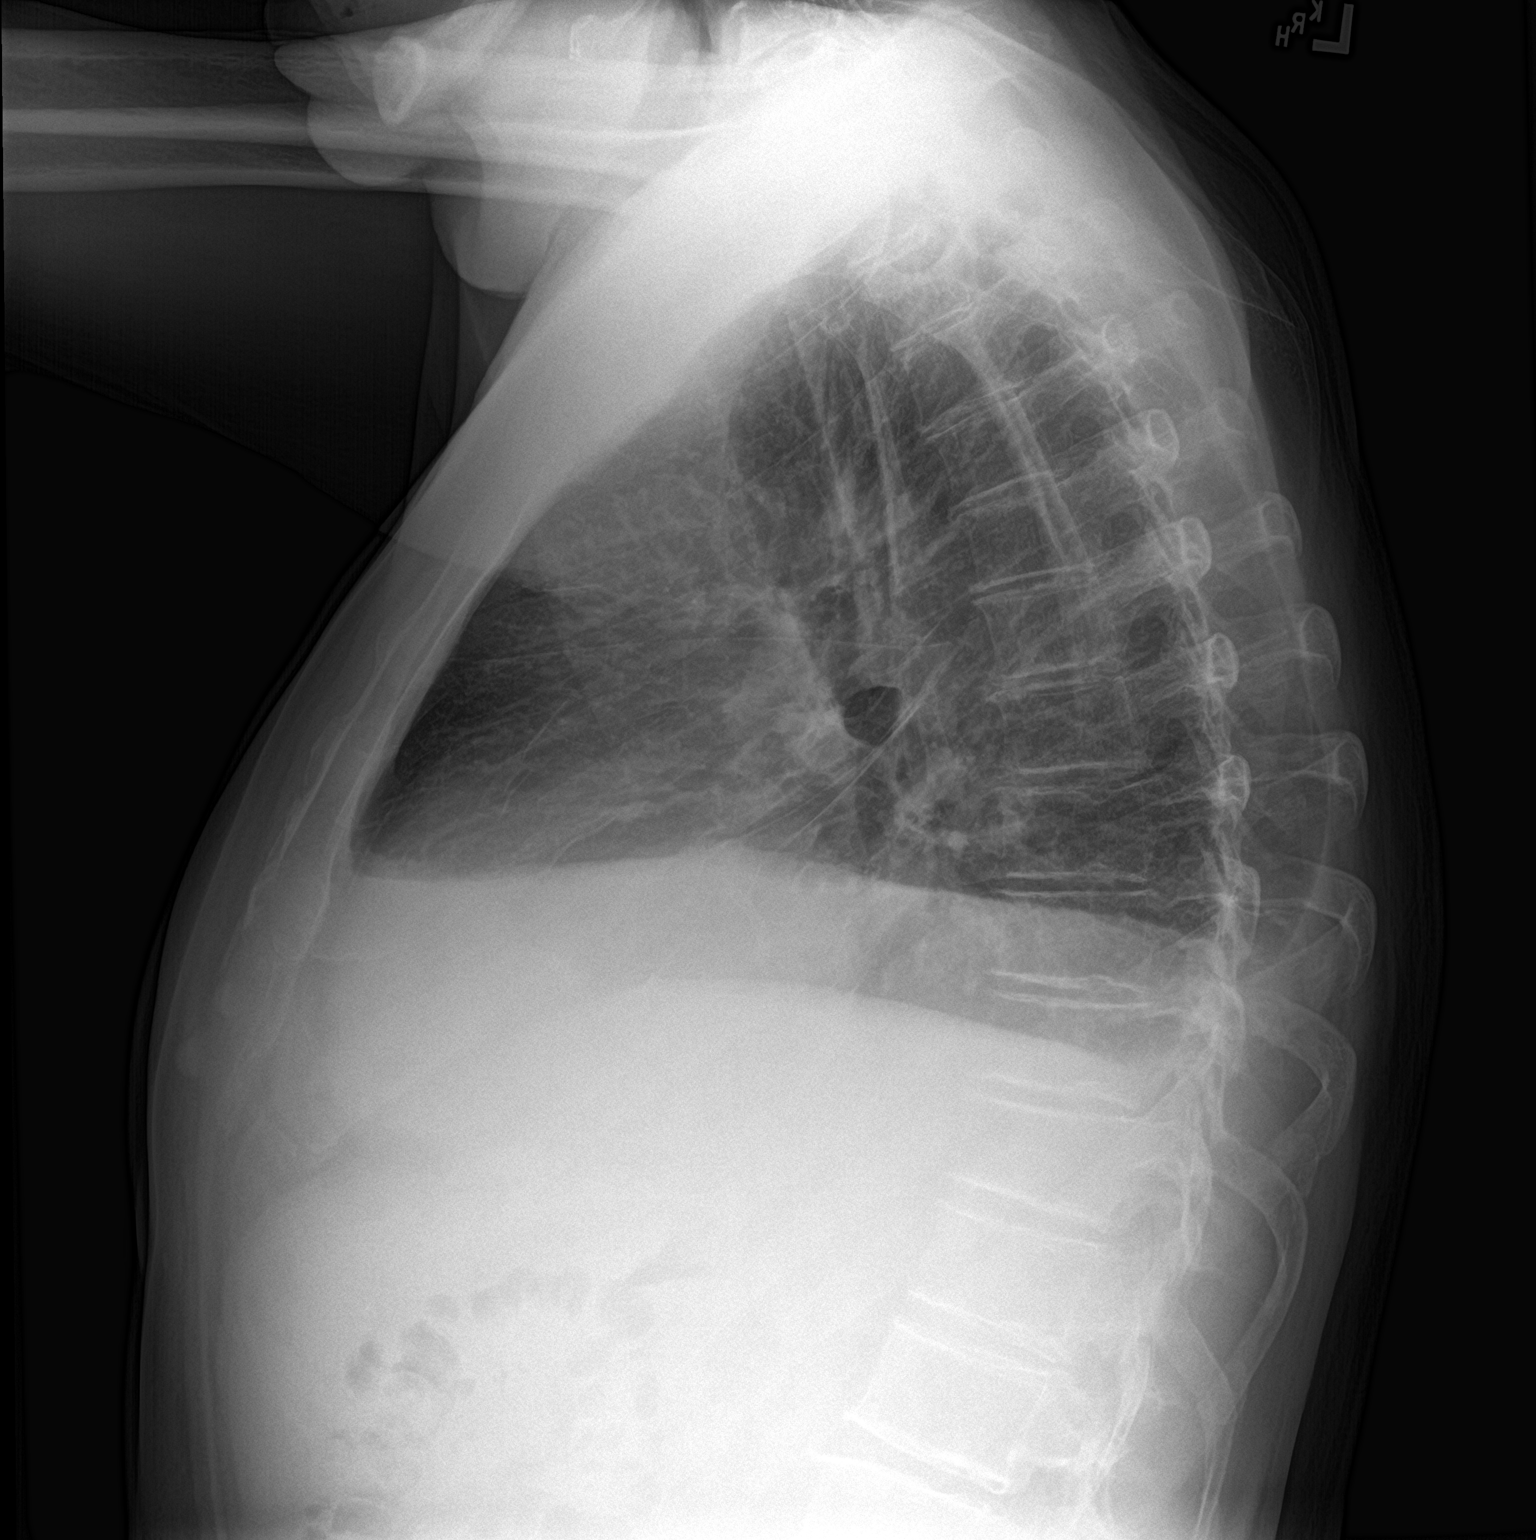

[2 of 2 positions shown; findings below may reference images not displayed]

FINDINGS: There is small right pleural effusion slightly larger than on the
previous abdominal series. There is a trace left pleural effusion
which is stable. The cardiac silhouette is mildly enlarged. The
pulmonary vascularity is minimally prominent centrally though
stable. The mediastinum is normal in width. The bony thorax is
unremarkable.
IMPRESSION: Small right pleural effusion with trace left pleural effusion likely
secondary to low-grade CHF. There is no alveolar pneumonia.
